# Patient Record
Sex: Female | Born: 2000 | Hispanic: No | Marital: Single | Smoking: Never smoker
Health system: Southern US, Community
[De-identification: ages and names within clinical notes are randomized; demographics above are authoritative.]

## PROBLEM LIST (undated history)

## (undated) DIAGNOSIS — F419 Anxiety disorder, unspecified: Secondary | ICD-10-CM

## (undated) DIAGNOSIS — L309 Dermatitis, unspecified: Secondary | ICD-10-CM

## (undated) DIAGNOSIS — K222 Esophageal obstruction: Secondary | ICD-10-CM

## (undated) HISTORY — DX: Esophageal obstruction: K22.2

---
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
## 2020-01-15 ENCOUNTER — Other Ambulatory Visit: Payer: Self-pay

## 2020-01-15 ENCOUNTER — Emergency Department (HOSPITAL_COMMUNITY)
Admission: EM | Admit: 2020-01-15 | Discharge: 2020-01-15 | Disposition: A | Discharge status: Home / Self Care | Payer: Medicaid Other | Attending: Emergency Medicine | Admitting: Emergency Medicine

## 2020-01-15 ENCOUNTER — Emergency Department (HOSPITAL_COMMUNITY)
Admission: EM | Admit: 2020-01-15 | Discharge: 2020-01-16 | Disposition: A | Discharge status: Home / Self Care | Payer: Medicaid Other | Source: Home / Self Care | Attending: Emergency Medicine | Admitting: Emergency Medicine

## 2020-01-15 DIAGNOSIS — K0889 Other specified disorders of teeth and supporting structures: Secondary | ICD-10-CM

## 2020-01-15 DIAGNOSIS — J45909 Unspecified asthma, uncomplicated: Secondary | ICD-10-CM | POA: Insufficient documentation

## 2020-01-15 MED ORDER — AMOXICILLIN 400 MG/5ML PO SUSR
800.0000 mg | Freq: Two times a day (BID) | ORAL | 0 refills | Status: AC
Start: 2020-01-15 — End: 2020-01-22

## 2020-01-15 NOTE — ED Triage Notes (Signed)
Pt said she has been having right upper tooth pain x 4 days. Swelling in the gum area and hard to chew on that side.

## 2020-01-15 NOTE — ED Provider Notes (Signed)
[facility] EMERGENCY DEPARTMENT Provider Note   CSN: [id number] Arrival date & time: 01/15/20  3976     History Chief Complaint  Patient presents with  . Dental Pain    Autumn Aguilar is a 19 y.o. female.  HPI 19 year old female history of asthma, dental problems, presents today with dental pain in her right upper second and third molar.  Present for 7 days.  Is worse with pressure.  She feels like she may have had some swelling.  She is using ibuprofen at home every 6 hours which relieves the pain for about 40 minutes.  She denies any fever, chills, difficulty speaking, swallowing, or breathing.  She has not had any fever.    Past Medical History:  Diagnosis Date  . Anxiety   . Asthma   . Eczema   . Esophageal stricture     Patient Active Problem List   Diagnosis Date Noted  . Mid back pain 11/11/2016  . Esophageal stricture     No past surgical history on file.   OB History   No obstetric history on file.     No family history on file.  Social History   Tobacco Use  . Smoking status: Never Smoker  . Smokeless tobacco: Never Used  Vaping Use  . Vaping Use: Never used  Substance Use Topics  . Alcohol use: No  . Drug use: No    Home Medications Prior to Admission medications   Medication Sig Start Date End Date Taking? Authorizing Provider  albuterol (PROVENTIL) (2.5 MG/3ML) 0.083% nebulizer solution Take 3 mLs (2.5 mg total) by nebulization every 4 (four) hours as needed for wheezing or shortness of breath. 02/13/19   Maxwell Caul, PA-C  albuterol (VENTOLIN HFA) 108 (90 Base) MCG/ACT inhaler Inhale 2 puffs into the lungs every 4 (four) hours as needed for shortness of breath. 02/13/19   Maxwell Caul, PA-C  amoxicillin (AMOXIL) 400 MG/5ML suspension Take 10 mLs (800 mg total) by mouth in the morning and at bedtime for 7 days. 01/15/20 01/22/20  Margarita Grizzle, MD  B Complex Vitamins (VITAMIN B COMPLEX PO) Take 1 tablet by mouth  daily.    [provider]  benzonatate (TESSALON) 100 MG capsule Take 1-2 capsules (100-200 mg total) by mouth 3 (three) times daily as needed for cough. 11/23/19   Wallis Bamberg, PA-C  cetirizine (ZYRTEC ALLERGY) 10 MG tablet Take 1 tablet (10 mg total) by mouth daily. 11/23/19   Wallis Bamberg, PA-C  fluticasone (FLONASE) 50 MCG/ACT nasal spray Place 1 spray into both nostrils daily. 02/13/19   Graciella Freer A, PA-C  ipratropium (ATROVENT) 0.06 % nasal spray Place 1 spray into both nostrils every 6 (six) hours as needed for rhinitis.  12/22/18   [provider]  promethazine-dextromethorphan (PROMETHAZINE-DM) 6.25-15 MG/5ML syrup Take 5 mLs by mouth at bedtime as needed for cough. 11/23/19   Wallis Bamberg, PA-C  pseudoephedrine (SUDAFED) 30 MG tablet Take 1 tablet (30 mg total) by mouth every 8 (eight) hours as needed for congestion. 11/23/19   Wallis Bamberg, PA-C  rizatriptan (MAXALT) 10 MG tablet Take 1 tablet earliest onset of migraine.  May repeat in 2 hours if needed.  Maximum 2 tablets in 24 hours. 02/09/19   Drema Dallas, DO  nortriptyline (PAMELOR) 10 MG capsule Take 1 capsule at bedtime for a week, then increase to 2 capsules at bedtime. 02/09/19 11/23/19  Drema Dallas, DO    Allergies  Bee venom, Kiwi extract, and Pineapple  Review of Systems   Review of Systems  All other systems reviewed and are negative.   Physical Exam Updated Vital Signs BP 127/72 (BP Location: Left Arm)   Pulse (!) 107   Temp 98.3 F (36.8 C) (Oral)   Resp 20   SpO2 99%   Physical Exam Vitals and nursing note reviewed.  HENT:     Head: Normocephalic.     Right Ear: External ear normal.     Left Ear: External ear normal.     Nose: Nose normal.     Mouth/Throat:     Mouth: Mucous membranes are moist.     Pharynx: Oropharynx is clear.     Comments: Filling in place in right upper second molar some tenderness palpation around first and second molar. Eyes:     Pupils: Pupils are equal,  round, and reactive to light.  Musculoskeletal:        General: Normal range of motion.     Cervical back: Normal range of motion.  Skin:    General: Skin is warm.     Capillary Refill: Capillary refill takes less than 2 seconds.  Neurological:     General: No focal deficit present.     Mental Status: She is alert.     Gait: Gait normal.  Psychiatric:        Mood and Affect: Mood normal.        Behavior: Behavior normal.     ED Results / Procedures / Treatments   Labs (all labs ordered are listed, but only abnormal results are displayed) Labs Reviewed - No data to display  EKG None  Radiology No results found.  Procedures Procedures (including critical care time)  Medications Ordered in ED Medications - No data to display  ED Course  I have reviewed the triage vital signs and the nursing notes.  Pertinent labs & imaging results that were available during my care of the patient were reviewed by me and considered in my medical decision making (see chart for details).    MDM Rules/Calculators/A&P                          Dental pain without any obvious abscess.  Amoxicillin prescribed as patient has difficulty swallowing pills.  Instructed regarding return precautions and need for follow-up and voices understanding. Final Clinical Impression(s) / ED Diagnoses Final diagnoses:  Pain, dental    Rx / DC Orders ED Discharge Orders         Ordered    amoxicillin (AMOXIL) 400 MG/5ML suspension  2 times daily        01/15/20 0730           Margarita Grizzle, MD 01/15/20 [phone]

## 2020-01-16 ENCOUNTER — Other Ambulatory Visit: Payer: Self-pay

## 2020-01-16 ENCOUNTER — Encounter (HOSPITAL_COMMUNITY): Payer: Self-pay | Admitting: *Deleted

## 2020-01-16 MED ORDER — HYDROCODONE-ACETAMINOPHEN 5-325 MG PO TABS
1.0000 | ORAL_TABLET | Freq: Once | ORAL | Status: AC
  Administered 2020-01-16: 1 via ORAL
  Filled 2020-01-16: qty 1

## 2020-01-16 NOTE — ED Triage Notes (Signed)
The pt has a toothache for 4-5 days she was seen here earlier today wand was given antiobotics she thinks the swelling is getting worse  So she came back   lmp now

## 2020-01-16 NOTE — ED Provider Notes (Signed)
[facility] EMERGENCY DEPARTMENT Provider Note   CSN: [id number] Arrival date & time: 01/15/20  2355     History Chief Complaint  Patient presents with   Dental Pain    Autumn Aguilar is a 19 y.o. female.  The history is provided by the patient.  Dental Pain Location:  Upper Quality:  Aching Severity:  Moderate Onset quality:  Gradual Timing:  Constant Progression:  Worsening Chronicity:  New Relieved by:  Nothing Worsened by:  Nothing Associated symptoms: fever    Patient was just seen in the ER for dental pain, but now is worried due to facial swelling.  No visual changes.  No fevers.    Past Medical History:  Diagnosis Date   Anxiety    Asthma    Eczema    Esophageal stricture     Patient Active Problem List   Diagnosis Date Noted   Mid back pain 11/11/2016   Esophageal stricture     History reviewed. No pertinent surgical history.   OB History   No obstetric history on file.     No family history on file.  Social History   Tobacco Use   Smoking status: Never Smoker   Smokeless tobacco: Never Used  Vaping Use   Vaping Use: Never used  Substance Use Topics   Alcohol use: No   Drug use: No    Home Medications Prior to Admission medications   Medication Sig Start Date End Date Taking? Authorizing Provider  albuterol (PROVENTIL) (2.5 MG/3ML) 0.083% nebulizer solution Take 3 mLs (2.5 mg total) by nebulization every 4 (four) hours as needed for wheezing or shortness of breath. 02/13/19   Maxwell Caul, PA-C  albuterol (VENTOLIN HFA) 108 (90 Base) MCG/ACT inhaler Inhale 2 puffs into the lungs every 4 (four) hours as needed for shortness of breath. 02/13/19   Maxwell Caul, PA-C  amoxicillin (AMOXIL) 400 MG/5ML suspension Take 10 mLs (800 mg total) by mouth in the morning and at bedtime for 7 days. 01/15/20 01/22/20  Margarita Grizzle, MD  B Complex Vitamins (VITAMIN B COMPLEX PO) Take 1 tablet by mouth daily.     [provider]  benzonatate (TESSALON) 100 MG capsule Take 1-2 capsules (100-200 mg total) by mouth 3 (three) times daily as needed for cough. 11/23/19   Wallis Bamberg, PA-C  cetirizine (ZYRTEC ALLERGY) 10 MG tablet Take 1 tablet (10 mg total) by mouth daily. 11/23/19   Wallis Bamberg, PA-C  fluticasone (FLONASE) 50 MCG/ACT nasal spray Place 1 spray into both nostrils daily. 02/13/19   Graciella Freer A, PA-C  ipratropium (ATROVENT) 0.06 % nasal spray Place 1 spray into both nostrils every 6 (six) hours as needed for rhinitis.  12/22/18   [provider]  promethazine-dextromethorphan (PROMETHAZINE-DM) 6.25-15 MG/5ML syrup Take 5 mLs by mouth at bedtime as needed for cough. 11/23/19   Wallis Bamberg, PA-C  pseudoephedrine (SUDAFED) 30 MG tablet Take 1 tablet (30 mg total) by mouth every 8 (eight) hours as needed for congestion. 11/23/19   Wallis Bamberg, PA-C  rizatriptan (MAXALT) 10 MG tablet Take 1 tablet earliest onset of migraine.  May repeat in 2 hours if needed.  Maximum 2 tablets in 24 hours. 02/09/19   Drema Dallas, DO  nortriptyline (PAMELOR) 10 MG capsule Take 1 capsule at bedtime for a week, then increase to 2 capsules at bedtime. 02/09/19 11/23/19  Drema Dallas, DO    Allergies    Bee venom, Kiwi extract, and Pineapple  Review of Systems   Review of Systems  Constitutional: Positive for fever.  HENT: Positive for dental problem.     Physical Exam Updated Vital Signs BP 125/85    Pulse 100    Temp 98.4 F (36.9 C)    Resp 17    Ht 1.778 m (5\' 10" )    Wt 56.7 kg    LMP 01/16/2020    SpO2 100%    BMI 17.94 kg/m   Physical Exam CONSTITUTIONAL: Well developed/well nourished HEAD: Normocephalic/atraumatic EYES: EOMI/PERRL ENMT: Mucous membranes moist, no significant facial swelling.  Tenderness noted to right upper molars.  No trismus, no drooling NECK: supple no meningeal signs LUNGS:  no apparent distress ABDOMEN: soft NEURO: Pt is awake/alert/appropriate, moves all  extremitiesx4.  No facial droop.   EXTREMITIES:  full ROM SKIN: warm, color normal PSYCH: no abnormalities of mood noted, alert and oriented to situation  ED Results / Procedures / Treatments   Labs (all labs ordered are listed, but only abnormal results are displayed) Labs Reviewed - No data to display  EKG None  Radiology No results found.  Procedures Procedures (including critical care time)  Medications Ordered in ED Medications  HYDROcodone-acetaminophen (NORCO/VICODIN) 5-325 MG per tablet 1 tablet (has no administration in time range)    ED Course  I have reviewed the triage vital signs and the nursing notes.     MDM Rules/Calculators/A&P                          Continue antibiotics.  No indication for imaging.  Follow-up with dentistry next week Final Clinical Impression(s) / ED Diagnoses Final diagnoses:  Tooth pain    Rx / DC Orders ED Discharge Orders    None       01/18/2020, MD 01/16/20 [phone]

## 2020-02-05 ENCOUNTER — Other Ambulatory Visit: Payer: Self-pay

## 2020-02-05 ENCOUNTER — Encounter (HOSPITAL_BASED_OUTPATIENT_CLINIC_OR_DEPARTMENT_OTHER): Payer: Self-pay | Admitting: *Deleted

## 2020-02-05 ENCOUNTER — Emergency Department (HOSPITAL_BASED_OUTPATIENT_CLINIC_OR_DEPARTMENT_OTHER)
Admission: EM | Admit: 2020-02-05 | Discharge: 2020-02-05 | Disposition: A | Discharge status: Home / Self Care | Payer: Worker's Compensation | Attending: Emergency Medicine | Admitting: Emergency Medicine

## 2020-02-05 ENCOUNTER — Emergency Department (HOSPITAL_BASED_OUTPATIENT_CLINIC_OR_DEPARTMENT_OTHER): Payer: Worker's Compensation

## 2020-02-05 DIAGNOSIS — M542 Cervicalgia: Secondary | ICD-10-CM | POA: Insufficient documentation

## 2020-02-05 DIAGNOSIS — S161XXA Strain of muscle, fascia and tendon at neck level, initial encounter: Secondary | ICD-10-CM | POA: Insufficient documentation

## 2020-02-05 DIAGNOSIS — J45909 Unspecified asthma, uncomplicated: Secondary | ICD-10-CM | POA: Diagnosis not present

## 2020-02-05 DIAGNOSIS — M549 Dorsalgia, unspecified: Secondary | ICD-10-CM | POA: Insufficient documentation

## 2020-02-05 DIAGNOSIS — S0990XA Unspecified injury of head, initial encounter: Secondary | ICD-10-CM | POA: Diagnosis present

## 2020-02-05 DIAGNOSIS — W208XXA Other cause of strike by thrown, projected or falling object, initial encounter: Secondary | ICD-10-CM | POA: Diagnosis not present

## 2020-02-05 DIAGNOSIS — Y990 Civilian activity done for income or pay: Secondary | ICD-10-CM | POA: Diagnosis not present

## 2020-02-05 MED ORDER — IBUPROFEN 100 MG/5ML PO SUSP
400.0000 mg | Freq: Once | ORAL | Status: AC
  Administered 2020-02-05: 400 mg via ORAL
  Filled 2020-02-05: qty 20

## 2020-02-05 NOTE — ED Provider Notes (Signed)
[facility] EMERGENCY DEPARTMENT Provider Note   CSN: [id number] Arrival date & time: 02/05/20  1257     History Chief Complaint  Patient presents with  . Head Injury    Autumn Aguilar is a 19 y.o. female.  Patient is a 19 year old female presents after a head injury.  At around 10am this morning, she was at work and had 3 large bottle of coffee creamer fall on her.  On bottle fell on the top of her head, and two bottles rolled down her neck.  There was no LOC.  No n/v.  She had some dizziness earlier today for about the first hour after the incident, but is better now.  No vision changes currently.  No difficulty with ambulation.  She does have some pain to her neck and upper back.  No numbness or weakness to her extremities.  No other injuries.  She is not on anticoagulants.        Past Medical History:  Diagnosis Date  . Anxiety   . Asthma   . Eczema   . Esophageal stricture     Patient Active Problem List   Diagnosis Date Noted  . Mid back pain 11/11/2016  . Esophageal stricture     History reviewed. No pertinent surgical history.   OB History   No obstetric history on file.     History reviewed. No pertinent family history.  Social History   Tobacco Use  . Smoking status: Never Smoker  . Smokeless tobacco: Never Used  Vaping Use  . Vaping Use: Never used  Substance Use Topics  . Alcohol use: No  . Drug use: No    Home Medications Prior to Admission medications   Medication Sig Start Date End Date Taking? Authorizing Provider  albuterol (PROVENTIL) (2.5 MG/3ML) 0.083% nebulizer solution Take 3 mLs (2.5 mg total) by nebulization every 4 (four) hours as needed for wheezing or shortness of breath. 02/13/19   Maxwell Caul, PA-C  albuterol (VENTOLIN HFA) 108 (90 Base) MCG/ACT inhaler Inhale 2 puffs into the lungs every 4 (four) hours as needed for shortness of breath. 02/13/19   Graciella Freer A, PA-C  B Complex Vitamins (VITAMIN B  COMPLEX PO) Take 1 tablet by mouth daily.    [provider]  benzonatate (TESSALON) 100 MG capsule Take 1-2 capsules (100-200 mg total) by mouth 3 (three) times daily as needed for cough. 11/23/19   Wallis Bamberg, PA-C  cetirizine (ZYRTEC ALLERGY) 10 MG tablet Take 1 tablet (10 mg total) by mouth daily. 11/23/19   Wallis Bamberg, PA-C  fluticasone (FLONASE) 50 MCG/ACT nasal spray Place 1 spray into both nostrils daily. 02/13/19   Graciella Freer A, PA-C  ipratropium (ATROVENT) 0.06 % nasal spray Place 1 spray into both nostrils every 6 (six) hours as needed for rhinitis.  12/22/18   [provider]  promethazine-dextromethorphan (PROMETHAZINE-DM) 6.25-15 MG/5ML syrup Take 5 mLs by mouth at bedtime as needed for cough. 11/23/19   Wallis Bamberg, PA-C  pseudoephedrine (SUDAFED) 30 MG tablet Take 1 tablet (30 mg total) by mouth every 8 (eight) hours as needed for congestion. 11/23/19   Wallis Bamberg, PA-C  rizatriptan (MAXALT) 10 MG tablet Take 1 tablet earliest onset of migraine.  May repeat in 2 hours if needed.  Maximum 2 tablets in 24 hours. 02/09/19   Drema Dallas, DO  nortriptyline (PAMELOR) 10 MG capsule Take 1 capsule at bedtime for a week, then increase to 2 capsules at bedtime. 02/09/19  11/23/19  Drema Dallas, DO    Allergies    Bee venom, Kiwi extract, and Pineapple  Review of Systems   Review of Systems  Constitutional: Negative for chills, diaphoresis, fatigue and fever.  HENT: Negative for congestion, rhinorrhea and sneezing.   Eyes: Negative.   Respiratory: Negative for cough, chest tightness and shortness of breath.   Cardiovascular: Negative for chest pain and leg swelling.  Gastrointestinal: Negative for abdominal pain, blood in stool, diarrhea, nausea and vomiting.  Genitourinary: Negative for difficulty urinating, flank pain, frequency and hematuria.  Musculoskeletal: Positive for neck pain. Negative for arthralgias and back pain.  Skin: Negative for rash.   Neurological: Positive for dizziness and headaches. Negative for speech difficulty, weakness and numbness.    Physical Exam Updated Vital Signs BP 120/72 (BP Location: Right Arm)   Pulse 76   Temp 98.6 F (37 C) (Oral)   Resp 18   Ht 5\' 9"  (1.753 m)   Wt 56.7 kg   LMP 02/05/2020   SpO2 100%   BMI 18.46 kg/m   Physical Exam Constitutional:      Appearance: She is well-developed and well-nourished.  HENT:     Head: Normocephalic and atraumatic.  Eyes:     Pupils: Pupils are equal, round, and reactive to light.  Cardiovascular:     Rate and Rhythm: Normal rate and regular rhythm.     Heart sounds: Normal heart sounds.  Pulmonary:     Effort: Pulmonary effort is normal. No respiratory distress.     Breath sounds: Normal breath sounds. No wheezing or rales.  Chest:     Chest wall: No tenderness.  Abdominal:     General: Bowel sounds are normal.     Palpations: Abdomen is soft.     Tenderness: There is no abdominal tenderness. There is no guarding or rebound.  Musculoskeletal:        General: No edema. Normal range of motion.     Cervical back: Tenderness present.     Comments: Some tenderness to bilateral cervical paraspinal muscles.  Mild midline tenderness.  No step offs or deformities.  No pain to thoracic or LS spine.  Lymphadenopathy:     Cervical: No cervical adenopathy.  Skin:    General: Skin is warm and dry.     Findings: No rash.  Neurological:     Mental Status: She is alert and oriented to person, place, and time.     Comments: Motor 5/5 all extremities Sensation grossly intact to LT all extremities Finger to Nose intact, no pronator drift CN II-XII grossly intact Gait normal   Psychiatric:        Mood and Affect: Mood and affect normal.     ED Results / Procedures / Treatments   Labs (all labs ordered are listed, but only abnormal results are displayed) Labs Reviewed - No data to display  EKG None  Radiology DG Cervical Spine  Complete  Result Date: 02/05/2020 CLINICAL DATA:  Neck injury. EXAM: CERVICAL SPINE - COMPLETE 4+ VIEW COMPARISON:  None. FINDINGS: There is straightening of the normal cervical lordotic curvature. There is no prevertebral soft tissue swelling. There is no definite malalignment or acute displaced fracture. IMPRESSION: Negative cervical spine radiographs. Electronically Signed   By: 02/07/2020 M.D.   On: 02/05/2020 16:06    Procedures Procedures (including critical care time)  Medications Ordered in ED Medications - No data to display  ED Course  I have reviewed the triage vital signs  and the nursing notes.  Pertinent labs & imaging results that were available during my care of the patient were reviewed by me and considered in my medical decision making (see chart for details).    MDM Rules/Calculators/A&P                         Patient is a 19 year old female who presents after head injury.  She also presents with neck pain.  She has some symptoms of a mild concussion but she does not have any symptoms that warrant CT imaging.  There there are no clinical signs or symptoms that are concerning for intracranial hemorrhage.  She is neurologically intact.  She does have some neck pain which seems to be mostly musculoskeletal.  I had a low suspicion for bony injury given the mechanism of injury but the patient was requesting imaging.  I did do x-rays of the cervical spine which showed no acute abnormality.  These were also reviewed by me.  She was discharged home in good condition.  Symptomatic care instructions were given.  She was encouraged to follow-up with her doctor this week for recheck.  Return precautions were given. Final Clinical Impression(s) / ED Diagnoses Final diagnoses:  Injury of head, initial encounter  Cervical strain, acute, initial encounter    Rx / DC Orders ED Discharge Orders    None       Rolan Bucco, MD 02/05/20 1735

## 2020-02-05 NOTE — ED Triage Notes (Addendum)
Food Water quality scientist. Pt reports that a box with 4 coffee creamer bottles fell on her head at 10am this morning. Denies LOC. No hematoma noted. Ambulatory. Denies N/V, denies change in vision. Pt was evaluated by EMS after the accident.

## 2020-02-05 NOTE — ED Notes (Signed)
Pt discharged to home. Discharge instructions have been discussed with patient and/or family members. Pt verbally acknowledges understanding d/c instructions, and endorses comprehension to checkout at registration before leaving.  °

## 2020-02-15 ENCOUNTER — Ambulatory Visit (HOSPITAL_COMMUNITY)
Admission: EM | Admit: 2020-02-15 | Discharge: 2020-02-15 | Disposition: A | Discharge status: Home / Self Care | Payer: Medicaid Other | Attending: Family Medicine | Admitting: Family Medicine

## 2020-02-15 ENCOUNTER — Other Ambulatory Visit: Payer: Self-pay

## 2020-02-15 ENCOUNTER — Encounter (HOSPITAL_COMMUNITY): Payer: Self-pay

## 2020-02-15 DIAGNOSIS — Z20822 Contact with and (suspected) exposure to covid-19: Secondary | ICD-10-CM | POA: Diagnosis not present

## 2020-02-15 DIAGNOSIS — S0990XA Unspecified injury of head, initial encounter: Secondary | ICD-10-CM | POA: Insufficient documentation

## 2020-02-15 DIAGNOSIS — R519 Headache, unspecified: Secondary | ICD-10-CM | POA: Insufficient documentation

## 2020-02-15 DIAGNOSIS — X58XXXA Exposure to other specified factors, initial encounter: Secondary | ICD-10-CM | POA: Diagnosis not present

## 2020-02-15 DIAGNOSIS — R059 Cough, unspecified: Secondary | ICD-10-CM | POA: Diagnosis not present

## 2020-02-15 LAB — POCT RAPID STREP A, ED / UC: Streptococcus, Group A Screen (Direct): NEGATIVE

## 2020-02-15 NOTE — Discharge Instructions (Addendum)
You have been tested for COVID-19 today. If your test returns positive, you will receive a phone call from [facility] regarding your results. Negative test results are not called. Both positive and negative results area always visible on MyChart. If you do not have a MyChart account, sign up instructions are provided in your discharge papers. Please do not hesitate to contact us should you have questions or concerns.  You may use over the counter ibuprofen or acetaminophen as needed.  For a sore throat, over the counter products such as [employer] Peroxyl Mouth Sore Rinse or Chloraseptic Sore Throat Spray may provide some temporary relief. Your rapid strep test was negative today. We have sent your throat swab for culture and will let you know of any positive results.  Do your best to ensure adequate rest. If not allergic, take acetaminophen (Tylenol) every 4-6 hours as needed for discomfort. Often individuals will develop a headache associated with mild nausea in the days or hours after a head injury. This is called a concussion and may require further follow up.  Please seek prompt medical care if: You have: A very bad (severe) headache that is not helped by medicine. Trouble walking or weakness in your arms and legs. Clear or bloody fluid coming from your nose or ears. Changes in your seeing (vision). Jerky movements that you cannot control (seizure). You throw up (vomit). Your symptoms get worse. You lose balance. Your speech is slurred. You pass out. You are sleepier and have trouble staying awake. The black centers of your eyes (pupils) change in size.  These symptoms may be an emergency. Do not wait to see if the symptoms will go away. Get medical help right away. Call your local emergency services. Do not drive yourself to the hospital.

## 2020-02-15 NOTE — ED Triage Notes (Signed)
Pt c/o headaches x 5 days. Pt states she developed a fever and cough yesterday. She states her left tonsil hurts and she states she feels like she is swallowing nails. Pt states she has not eben able to drink or eat. Pt states she also feels dizzy at time and states she recently had a concussion. Pt states last Thursday she was at work when a box of creamers fell on her head and neck from 61ft high.

## 2020-02-15 NOTE — ED Provider Notes (Signed)
[facility] CARE CENTER   [id number] 02/15/20 Arrival Time: 1418  ASSESSMENT & PLAN:  1. Cough   2. Nonintractable headache, unspecified chronicity pattern, unspecified headache type   3. Minor head injury without loss of consciousness, initial encounter      COVID-19 testing sent. See letter/work note on file for self-isolation guidelines. OTC symptom care as needed. Rapid strep negative. Culture sent.    Discharge Instructions     You have been tested for COVID-19 today. If your test returns positive, you will receive a phone call from [facility] regarding your results. Negative test results are not called. Both positive and negative results area always visible on MyChart. If you do not have a MyChart account, sign up instructions are provided in your discharge papers. Please do not hesitate to contact us should you have questions or concerns.   You may use over the counter ibuprofen or acetaminophen as needed.   For a sore throat, over the counter products such as [employer]gate Peroxyl Mouth Sore Rinse or Chloraseptic Sore Throat Spray may provide some temporary relief.  Your rapid strep test was negative today. We have sent your throat swab for culture and will let you know of any positive results.  Do your best to ensure adequate rest. If not allergic, take acetaminophen (Tylenol) every 4-6 hours as needed for discomfort. Often individuals will develop a headache associated with mild nausea in the days or hours after a head injury. This is called a concussion and may require further follow up.  Please seek prompt medical care if:  You have: ? A very bad (severe) headache that is not helped by medicine. ? Trouble walking or weakness in your arms and legs. ? Clear or bloody fluid coming from your nose or ears. ? Changes in your seeing (vision). ? Jerky movements that you cannot control (seizure).  You throw up (vomit).  Your symptoms get worse.  You lose  balance.  Your speech is slurred.  You pass out.  You are sleepier and have trouble staying awake.  The black centers of your eyes (pupils) change in size.  These symptoms may be an emergency. Do not wait to see if the symptoms will go away. Get medical help right away. Call your local emergency services. Do not drive yourself to the hospital.        Follow-up Information    [facility].   Specialty: Pediatrics Why: As needed. Contact information: [address] [place] [address] [phone]        Schedule an appointment as soon as possible for a visit  with [facility].   Why: To be evaluated after your head injury. Contact information: [address] [place] [address] [phone]              Reviewed expectations re: course of current medical issues. Questions answered. Outlined signs and symptoms indicating need for more acute intervention. Understanding verbalized. After Visit Summary given.   SUBJECTIVE: History from: patient. Autumn Aguilar is a 19 y.o. female who presents with worries regarding COVID-19. Known COVID-19 contact: none. Recent travel: none. Reports: subj fever and cough and ST since yesterday; sibling also not feeling well. Denies: difficulty breathing. Normal PO intake without n/v/d. Also reports minor head injury six days ago; details in triage note. Reports was seen in ED. No new symptoms.   OBJECTIVE:  Vitals:   02/15/20 1730  BP: (!) 102/52  Pulse: 89  Resp: 16  Temp: 97.9 F (36.6 C)  TempSrc: Oral  SpO2: 100%    General appearance: alert; no distress Eyes: PERRLA; EOMI; conjunctiva normal HENT: [place]; AT; with mild nasal congestion; throat with mild erythema and slight tonsil enlargement Neck: supple  Lungs: speaks full sentences without difficulty; unlabored Extremities: no edema Skin: warm and dry Neurologic: normal  gait Psychological: alert and cooperative; normal mood and affect  Labs: Results for orders placed or performed during the hospital encounter of 02/15/20  POCT Rapid Strep A  Result Value Ref Range   Streptococcus, Group A Screen (Direct) NEGATIVE NEGATIVE   Labs Reviewed  CULTURE, GROUP A STREP ([facility])  SARS CORONAVIRUS 2 (TAT 6-24 HRS)  POCT RAPID STREP A, ED / UC     Allergies  Allergen Reactions  . Bee Venom Anaphylaxis  . Kiwi Extract   . Pineapple Swelling    Swelling of lips and throat    Past Medical History:  Diagnosis Date  . Anxiety   . Asthma   . Eczema   . Esophageal stricture    Social History   Socioeconomic History  . Marital status: Single    Spouse name: Not on file  . Number of children: Not on file  . Years of education: college freshman  . Highest education level: Some college, no degree  Occupational History  . Occupation: Consulting civil engineer  Tobacco Use  . Smoking status: Never Smoker  . Smokeless tobacco: Never Used  Vaping Use  . Vaping Use: Never used  Substance and Sexual Activity  . Alcohol use: No  . Drug use: No  . Sexual activity: Yes    Birth control/protection: Implant  Other Topics Concern  . Not on file  Social History Narrative   Right handed   One story home   Occasionally caffeine   Social Determinants of Health   Financial Resource Strain: Not on file  Food Insecurity: Not on file  Transportation Needs: Not on file  Physical Activity: Not on file  Stress: Not on file  Social Connections: Not on file  Intimate Partner Violence: Not on file   History reviewed. No pertinent family history. History reviewed. No pertinent surgical history.   Mardella Layman, MD 02/15/20 [phone]

## 2020-02-16 LAB — SARS CORONAVIRUS 2 (TAT 6-24 HRS): SARS Coronavirus 2: NEGATIVE

## 2020-02-17 ENCOUNTER — Telehealth (HOSPITAL_COMMUNITY): Payer: Self-pay | Admitting: Emergency Medicine

## 2020-02-17 LAB — CULTURE, GROUP A STREP (THRC)

## 2020-02-17 MED ORDER — AMOXICILLIN 250 MG/5ML PO SUSR
500.0000 mg | Freq: Two times a day (BID) | ORAL | 0 refills | Status: AC
Start: 2020-02-17 — End: 2020-02-27

## 2020-05-16 ENCOUNTER — Other Ambulatory Visit: Payer: Self-pay

## 2020-05-16 ENCOUNTER — Encounter (HOSPITAL_COMMUNITY): Payer: Self-pay

## 2020-05-16 ENCOUNTER — Emergency Department (HOSPITAL_COMMUNITY)
Admission: EM | Admit: 2020-05-16 | Discharge: 2020-05-16 | Disposition: A | Discharge status: Home / Self Care | Payer: Medicaid Other | Attending: Emergency Medicine | Admitting: Emergency Medicine

## 2020-05-16 DIAGNOSIS — Z7952 Long term (current) use of systemic steroids: Secondary | ICD-10-CM | POA: Diagnosis not present

## 2020-05-16 DIAGNOSIS — Z20822 Contact with and (suspected) exposure to covid-19: Secondary | ICD-10-CM | POA: Insufficient documentation

## 2020-05-16 DIAGNOSIS — R42 Dizziness and giddiness: Secondary | ICD-10-CM | POA: Diagnosis present

## 2020-05-16 DIAGNOSIS — R350 Frequency of micturition: Secondary | ICD-10-CM | POA: Insufficient documentation

## 2020-05-16 DIAGNOSIS — R55 Syncope and collapse: Secondary | ICD-10-CM | POA: Insufficient documentation

## 2020-05-16 DIAGNOSIS — J45909 Unspecified asthma, uncomplicated: Secondary | ICD-10-CM | POA: Insufficient documentation

## 2020-05-16 LAB — BASIC METABOLIC PANEL
Anion gap: 6 (ref 5–15)
BUN: 9 mg/dL (ref 6–20)
CO2: 26 mmol/L (ref 22–32)
Calcium: 9.3 mg/dL (ref 8.9–10.3)
Chloride: 107 mmol/L (ref 98–111)
Creatinine, Ser: 0.8 mg/dL (ref 0.44–1.00)
GFR, Estimated: >60 mL/min (ref >60)
Glucose, Bld: 85 mg/dL (ref 70–99)
Potassium: 3.4 mmol/L — ABNORMAL LOW (ref 3.5–5.1)
Sodium: 139 mmol/L (ref 135–145)

## 2020-05-16 LAB — CBC
HCT: 42 % (ref 36.0–46.0)
Hemoglobin: 14.2 g/dL (ref 12.0–15.0)
MCH: 31.2 pg (ref 26.0–34.0)
MCHC: 33.8 g/dL (ref 30.0–36.0)
MCV: 92.3 fL (ref 80.0–100.0)
Platelets: 251 10*3/uL (ref 150–400)
RBC: 4.55 MIL/uL (ref 3.87–5.11)
RDW: 12.3 % (ref 11.5–15.5)
WBC: 6.8 10*3/uL (ref 4.0–10.5)
nRBC: 0 % (ref 0.0–0.2)

## 2020-05-16 LAB — URINALYSIS, ROUTINE W REFLEX MICROSCOPIC
Glucose, UA: NEGATIVE mg/dL
Ketones, ur: 5 mg/dL — AB
Leukocytes,Ua: NEGATIVE
Nitrite: NEGATIVE
Protein, ur: 30 mg/dL — AB
Specific Gravity, Urine: 1.036 — ABNORMAL HIGH (ref 1.005–1.030)
pH: 5 (ref 5.0–8.0)

## 2020-05-16 LAB — I-STAT BETA HCG BLOOD, ED (MC, WL, AP ONLY): I-stat hCG, quantitative: <5 m[IU]/mL (ref <5)

## 2020-05-16 LAB — POC SARS CORONAVIRUS 2 AG -  ED: SARS Coronavirus 2 Ag: NEGATIVE

## 2020-05-16 MED ORDER — CEPHALEXIN 500 MG PO CAPS
500.0000 mg | ORAL_CAPSULE | Freq: Two times a day (BID) | ORAL | 0 refills | Status: AC
Start: 2020-05-16 — End: 2020-05-23

## 2020-05-16 NOTE — ED Triage Notes (Signed)
Patient complains of 4 days of congestion, cough, dizziness. Complains of fatigue and noted she had fever when she awoke this am. Patient alert and oriented.

## 2020-05-16 NOTE — ED Provider Notes (Signed)
[facility] EMERGENCY DEPARTMENT Provider Note   CSN: [id number] Arrival date & time: 05/16/20  1240     History No chief complaint on file.   Autumn Aguilar is a 20 y.o. female.  HPI   The patient is a 20 year old female, she currently takes no specific daily medications.  She had been on a birth control Nexplanon in the past but that it been removed.  She does have a history of anxiety but is not currently medicated.  The patient reports that for the last several months she has had a problem with dizziness which she describes as feeling lightheaded, near syncopal, she starts to see black spots and this often happens when she is standing and walking and rarely happens when she is sitting or laying down.  She denies any changes in vision or speech, she has no numbness or weakness, she has no diarrhea or constipation, she drinks about 4 bottles of water a day totaling less than 80 ounces.  She has no dysuria but has had some frequency, she denies having any hematuria, she is not on her menstrual cycle at this time and denies any abdominal pain chest pain coughing or shortness of breath.  She has trouble sleeping at night and gets only about 3 to 4 hours per night, she underwent a sleep study ordered by her pediatrician several months ago but is never been told that the results were abnormal or normal.  She tries to use melatonin and Benadryl and ultimately is not having any success with that.  She states that she is eating normally, she does report having some possible hypoglycemia in the past but states she has not checked this in a while   Past Medical History:  Diagnosis Date  . Anxiety   . Asthma   . Eczema   . Esophageal stricture     Patient Active Problem List   Diagnosis Date Noted  . Mid back pain 11/11/2016  . Esophageal stricture     History reviewed. No pertinent surgical history.   OB History   No obstetric history on file.     No family  history on file.  Social History   Tobacco Use  . Smoking status: Never Smoker  . Smokeless tobacco: Never Used  Vaping Use  . Vaping Use: Never used  Substance Use Topics  . Alcohol use: No  . Drug use: No    Home Medications Prior to Admission medications   Medication Sig Start Date End Date Taking? Authorizing Provider  cephALEXin (KEFLEX) 500 MG capsule Take 1 capsule (500 mg total) by mouth 2 (two) times daily for 7 days. 05/16/20 05/23/20 Yes Eber Hong, MD  albuterol (PROVENTIL) (2.5 MG/3ML) 0.083% nebulizer solution Take 3 mLs (2.5 mg total) by nebulization every 4 (four) hours as needed for wheezing or shortness of breath. 02/13/19   Maxwell Caul, PA-C  albuterol (VENTOLIN HFA) 108 (90 Base) MCG/ACT inhaler Inhale 2 puffs into the lungs every 4 (four) hours as needed for shortness of breath. 02/13/19   Graciella Freer A, PA-C  B Complex Vitamins (VITAMIN B COMPLEX PO) Take 1 tablet by mouth daily.    [provider]  benzonatate (TESSALON) 100 MG capsule Take 1-2 capsules (100-200 mg total) by mouth 3 (three) times daily as needed for cough. 11/23/19   Wallis Bamberg, PA-C  cetirizine (ZYRTEC ALLERGY) 10 MG tablet Take 1 tablet (10 mg total) by mouth daily. 11/23/19   Wallis Bamberg, PA-C  fluticasone (FLONASE) 50 MCG/ACT nasal spray Place 1 spray into both nostrils daily. 02/13/19   Graciella Freer A, PA-C  ipratropium (ATROVENT) 0.06 % nasal spray Place 1 spray into both nostrils every 6 (six) hours as needed for rhinitis.  12/22/18   [provider]  promethazine-dextromethorphan (PROMETHAZINE-DM) 6.25-15 MG/5ML syrup Take 5 mLs by mouth at bedtime as needed for cough. 11/23/19   Wallis Bamberg, PA-C  pseudoephedrine (SUDAFED) 30 MG tablet Take 1 tablet (30 mg total) by mouth every 8 (eight) hours as needed for congestion. 11/23/19   Wallis Bamberg, PA-C  rizatriptan (MAXALT) 10 MG tablet Take 1 tablet earliest onset of migraine.  May repeat in 2 hours if needed.  Maximum  2 tablets in 24 hours. 02/09/19   Drema Dallas, DO  nortriptyline (PAMELOR) 10 MG capsule Take 1 capsule at bedtime for a week, then increase to 2 capsules at bedtime. 02/09/19 11/23/19  Drema Dallas, DO    Allergies    Bee venom, Kiwi extract, and Pineapple  Review of Systems   Review of Systems  All other systems reviewed and are negative.   Physical Exam Updated Vital Signs BP (!) 109/44 (BP Location: Right Arm)   Pulse 86   Temp 97.8 F (36.6 C) (Oral)   Resp 19   SpO2 100%   Physical Exam Vitals and nursing note reviewed.  Constitutional:      General: She is not in acute distress.    Appearance: She is well-developed.  HENT:     Head: Normocephalic and atraumatic.     Nose: No rhinorrhea.     Mouth/Throat:     Mouth: Mucous membranes are moist.     Pharynx: No oropharyngeal exudate.  Eyes:     General: No scleral icterus.       Right eye: No discharge.        Left eye: No discharge.     Conjunctiva/sclera: Conjunctivae normal.     Pupils: Pupils are equal, round, and reactive to light.  Neck:     Thyroid: No thyromegaly.     Vascular: No JVD.  Cardiovascular:     Rate and Rhythm: Normal rate and regular rhythm.     Heart sounds: Normal heart sounds. No murmur heard. No friction rub. No gallop.   Pulmonary:     Effort: Pulmonary effort is normal. No respiratory distress.     Breath sounds: Normal breath sounds. No wheezing or rales.  Abdominal:     General: Bowel sounds are normal. There is no distension.     Palpations: Abdomen is soft. There is no mass.     Tenderness: There is no abdominal tenderness.  Musculoskeletal:        General: No tenderness or deformity. Normal range of motion.     Cervical back: Normal range of motion and neck supple.     Right lower leg: No edema.     Left lower leg: No edema.  Lymphadenopathy:     Cervical: No cervical adenopathy.  Skin:    General: Skin is warm and dry.     Findings: No erythema or rash.   Neurological:     Mental Status: She is alert.     Coordination: Coordination normal.     Comments: Normal speech and coordination, normal cranial nerves III through XII, normal strength in all 4 extremities, normal gait, normal memory  Psychiatric:        Behavior: Behavior normal.     ED Results /  Procedures / Treatments   Labs (all labs ordered are listed, but only abnormal results are displayed) Labs Reviewed  BASIC METABOLIC PANEL - Abnormal; Notable for the following components:      Result Value   Potassium 3.4 (*)    All other components within normal limits  URINALYSIS, ROUTINE W REFLEX MICROSCOPIC - Abnormal; Notable for the following components:   Color, Urine AMBER (*)    APPearance HAZY (*)    Specific Gravity, Urine 1.036 (*)    Hgb urine dipstick SMALL (*)    Bilirubin Urine MODERATE (*)    Ketones, ur 5 (*)    Protein, ur 30 (*)    Bacteria, UA MANY (*)    All other components within normal limits  URINE CULTURE  CBC  TSH  POC SARS CORONAVIRUS 2 AG -  ED  I-STAT BETA HCG BLOOD, ED ([facility], [facility], [facility] ONLY)    EKG EKG Interpretation  Date/Time:  Tuesday May 16 2020 19:02:19 EDT Ventricular Rate:  75 PR Interval:  143 QRS Duration: 92 QT Interval:  382 QTC Calculation: 427 R Axis:   95 Text Interpretation: Sinus rhythm LAE, consider biatrial enlargement Borderline right axis deviation RSR' in V1 or V2, probably normal variant Confirmed by Eber Hong ([id number]) on 05/16/2020 7:05:30 PM   Radiology No results found.  Procedures Procedures   Medications Ordered in ED Medications - No data to display  ED Course  I have reviewed the triage vital signs and the nursing notes.  Pertinent labs & imaging results that were available during my care of the patient were reviewed by me and considered in my medical decision making (see chart for details).    MDM Rules/Calculators/A&P                          There is many squamous cells, there is bacteria  present in the urine, she does have some mild urinary frequency but her dizziness has been going on for months, she has a nonfocal exam and this seems to be more orthostatic or postural.  I encouraged her to drink more fluids, she is agreeable, her other labs appear unremarkable  Negative for Covid Not pregnant Electrolytes normal Creatinine normal No anemia or leukocytosis  Culture urine, refer back to PCP, encouraged at least 8 bottles of water a day, patient agreeable  We will send off thyroid studies, patient course to follow-up with PCP, local resources given.  Final Clinical Impression(s) / ED Diagnoses Final diagnoses:  Near syncope    Rx / DC Orders ED Discharge Orders         Ordered    cephALEXin (KEFLEX) 500 MG capsule  2 times daily        05/16/20 1901           Eber Hong, MD 05/16/20 1905

## 2020-05-16 NOTE — Discharge Instructions (Addendum)
Please drink at least 8 bottles of water a day  Your thyroid studies should be ready to see within the next couple of days, have your doctor follow-up these results  Return to the emergency department for any severe or worsening symptoms  Make sure that you are eating at least 3 meals a day, keep a snack with you at all times that if you get lightheaded you can have some food or water to drink.  Your testing today did not show any specific abnormalities other than a slight urinary infection  Take cephalexin twice a day for the next 7 days

## 2020-05-18 LAB — URINE CULTURE

## 2020-07-24 ENCOUNTER — Other Ambulatory Visit: Payer: Self-pay

## 2020-07-24 ENCOUNTER — Encounter (HOSPITAL_COMMUNITY): Payer: Self-pay

## 2020-07-24 ENCOUNTER — Ambulatory Visit (HOSPITAL_COMMUNITY)
Admission: EM | Admit: 2020-07-24 | Discharge: 2020-07-24 | Disposition: A | Discharge status: Home / Self Care | Payer: Medicaid Other | Attending: Urgent Care | Admitting: Urgent Care

## 2020-07-24 DIAGNOSIS — B349 Viral infection, unspecified: Secondary | ICD-10-CM | POA: Diagnosis not present

## 2020-07-24 DIAGNOSIS — J111 Influenza due to unidentified influenza virus with other respiratory manifestations: Secondary | ICD-10-CM | POA: Diagnosis present

## 2020-07-24 DIAGNOSIS — U071 COVID-19: Secondary | ICD-10-CM | POA: Insufficient documentation

## 2020-07-24 DIAGNOSIS — J45909 Unspecified asthma, uncomplicated: Secondary | ICD-10-CM | POA: Insufficient documentation

## 2020-07-24 DIAGNOSIS — Z79899 Other long term (current) drug therapy: Secondary | ICD-10-CM | POA: Insufficient documentation

## 2020-07-24 DIAGNOSIS — Z28310 Unvaccinated for covid-19: Secondary | ICD-10-CM | POA: Insufficient documentation

## 2020-07-24 DIAGNOSIS — Z91030 Bee allergy status: Secondary | ICD-10-CM | POA: Diagnosis not present

## 2020-07-24 LAB — POC INFLUENZA A AND B ANTIGEN (URGENT CARE ONLY)
INFLUENZA A ANTIGEN, POC: NEGATIVE
INFLUENZA B ANTIGEN, POC: NEGATIVE

## 2020-07-24 MED ORDER — CETIRIZINE HCL 10 MG PO TABS: 10.0000 mg | ORAL_TABLET | Freq: Every day | ORAL | 0 refills | Status: AC

## 2020-07-24 MED ORDER — PROMETHAZINE-DM 6.25-15 MG/5ML PO SYRP: 5.0000 mL | ORAL_SOLUTION | Freq: Every evening | ORAL | 0 refills | Status: DC | PRN

## 2020-07-24 MED ORDER — PSEUDOEPHEDRINE HCL 30 MG PO TABS: 30.0000 mg | ORAL_TABLET | Freq: Three times a day (TID) | ORAL | 0 refills | Status: DC | PRN

## 2020-07-24 NOTE — ED Provider Notes (Signed)
[name]   MRN: [id number] DOB: Oct 07, 2000  Subjective:   Autumn Aguilar is a 20 y.o. female presenting for 2-day history of acute onset dry cough, body aches, headache, fatigue.  Patient had close exposure to her nephew who tested positive for influenza.  Patient does not have vaccination against COVID or the flu.  Would like to get tested.  She has tried NyQuil with minimal relief.  Denies chest pain, shortness of breath.  No current facility-administered medications for this encounter.  Current Outpatient Medications:  .  albuterol (PROVENTIL) (2.5 MG/3ML) 0.083% nebulizer solution, Take 3 mLs (2.5 mg total) by nebulization every 4 (four) hours as needed for wheezing or shortness of breath., Disp: 75 mL, Rfl: 0 .  albuterol (VENTOLIN HFA) 108 (90 Base) MCG/ACT inhaler, Inhale 2 puffs into the lungs every 4 (four) hours as needed for shortness of breath., Disp: 8 g, Rfl: 1 .  B Complex Vitamins (VITAMIN B COMPLEX PO), Take 1 tablet by mouth daily., Disp: , Rfl:  .  benzonatate (TESSALON) 100 MG capsule, Take 1-2 capsules (100-200 mg total) by mouth 3 (three) times daily as needed for cough., Disp: 60 capsule, Rfl: 0 .  cetirizine (ZYRTEC ALLERGY) 10 MG tablet, Take 1 tablet (10 mg total) by mouth daily., Disp: 30 tablet, Rfl: 0 .  fluticasone (FLONASE) 50 MCG/ACT nasal spray, Place 1 spray into both nostrils daily., Disp: 16 g, Rfl: 2 .  ipratropium (ATROVENT) 0.06 % nasal spray, Place 1 spray into both nostrils every 6 (six) hours as needed for rhinitis. , Disp: , Rfl:  .  promethazine-dextromethorphan (PROMETHAZINE-DM) 6.25-15 MG/5ML syrup, Take 5 mLs by mouth at bedtime as needed for cough., Disp: 100 mL, Rfl: 0 .  pseudoephedrine (SUDAFED) 30 MG tablet, Take 1 tablet (30 mg total) by mouth every 8 (eight) hours as needed for congestion., Disp: 30 tablet, Rfl: 0 .  rizatriptan (MAXALT) 10 MG tablet, Take 1 tablet earliest onset of migraine.  May repeat in 2 hours if  needed.  Maximum 2 tablets in 24 hours., Disp: 10 tablet, Rfl: 3   Allergies  Allergen Reactions  . Bee Venom Anaphylaxis  . Kiwi Extract   . Pineapple Swelling    Swelling of lips and throat    Past Medical History:  Diagnosis Date  . Anxiety   . Asthma   . Eczema   . Esophageal stricture      History reviewed. No pertinent surgical history.  History reviewed. No pertinent family history.  Social History   Tobacco Use  . Smoking status: Never Smoker  . Smokeless tobacco: Never Used  Vaping Use  . Vaping Use: Never used  Substance Use Topics  . Alcohol use: No  . Drug use: No    ROS   Objective:   Vitals: BP (!) 106/58 (BP Location: Right Arm)   Pulse (!) 103   Temp 99.4 F (37.4 C) (Oral)   Resp 17   SpO2 100%   Physical Exam Constitutional:      General: She is not in acute distress.    Appearance: Normal appearance. She is well-developed. She is not ill-appearing, toxic-appearing or diaphoretic.  HENT:     Head: Normocephalic and atraumatic.     Right Ear: External ear normal.     Left Ear: External ear normal.     Nose: Nose normal.     Mouth/Throat:     Mouth: Mucous membranes are moist.     Pharynx:  No oropharyngeal exudate or posterior oropharyngeal erythema.     Comments: Significant postnasal drainage overlying pharynx. Eyes:     General: No scleral icterus.       Right eye: No discharge.        Left eye: No discharge.     Extraocular Movements: Extraocular movements intact.     Conjunctiva/sclera: Conjunctivae normal.     Pupils: Pupils are equal, round, and reactive to light.  Cardiovascular:     Rate and Rhythm: Normal rate and regular rhythm.     Pulses: Normal pulses.     Heart sounds: Normal heart sounds. No murmur heard. No friction rub. No gallop.   Pulmonary:     Effort: Pulmonary effort is normal. No respiratory distress.     Breath sounds: Normal breath sounds. No stridor. No wheezing, rhonchi or rales.  Skin:    General:  Skin is warm and dry.     Findings: No rash.  Neurological:     Mental Status: She is alert and oriented to person, place, and time.     Cranial Nerves: No cranial nerve deficit.     Motor: No weakness.     Coordination: Coordination normal.     Gait: Gait normal.     Deep Tendon Reflexes: Reflexes normal.  Psychiatric:        Mood and Affect: Mood normal.        Behavior: Behavior normal.        Thought Content: Thought content normal.        Judgment: Judgment normal.     Results for orders placed or performed during the hospital encounter of 07/24/20 (from the past 24 hour(s))  POC Influenza A & B Ag (Urgent Care)     Status: None   Collection Time: 07/24/20  7:32 PM  Result Value Ref Range   INFLUENZA A ANTIGEN, POC NEGATIVE NEGATIVE   INFLUENZA B ANTIGEN, POC NEGATIVE NEGATIVE     Assessment and Plan :   PDMP not reviewed this encounter.  1. Influenza-like illness     COVID 19 testing pending, will manage for influenza like viral illness. Recommended supportive care. Counseled patient on potential for adverse effects with medications prescribed/recommended today, ER and return-to-clinic precautions discussed, patient verbalized understanding.    Wallis Bamberg, [place] 07/24/20 1945

## 2020-07-24 NOTE — ED Triage Notes (Signed)
Pt presents with non productive cough, generalized body aches, headache, and fatigue X 2 days.

## 2020-07-24 NOTE — Discharge Instructions (Signed)

## 2020-07-25 LAB — SARS CORONAVIRUS 2 (TAT 6-24 HRS): SARS Coronavirus 2: POSITIVE — AB

## 2020-07-27 ENCOUNTER — Other Ambulatory Visit (HOSPITAL_BASED_OUTPATIENT_CLINIC_OR_DEPARTMENT_OTHER): Payer: Self-pay

## 2020-07-27 DIAGNOSIS — G478 Other sleep disorders: Secondary | ICD-10-CM

## 2020-08-30 ENCOUNTER — Other Ambulatory Visit (HOSPITAL_BASED_OUTPATIENT_CLINIC_OR_DEPARTMENT_OTHER): Payer: Self-pay

## 2020-08-30 DIAGNOSIS — G479 Sleep disorder, unspecified: Secondary | ICD-10-CM

## 2020-09-01 ENCOUNTER — Encounter (HOSPITAL_BASED_OUTPATIENT_CLINIC_OR_DEPARTMENT_OTHER): Payer: Medicaid Other | Admitting: Internal Medicine

## 2020-09-18 ENCOUNTER — Telehealth: Payer: Self-pay

## 2020-09-18 NOTE — Telephone Encounter (Signed)
Called and LVM for patient in regards to upcoming appointment, would like to know if patient has ever had a sleep study and if so where? If not, nothing further is needed.

## 2020-09-18 NOTE — Progress Notes (Signed)
09/19/20 19 yoF never smoker for sleep evaluation courtesy of Dr Holly Bodily with concern of sleep disorder. Medical problem list includes Asthma, Esophageal Stricture, Eczema, Anxiety, Headache, NPSG 08/09/19 Reuel Derby, MD)- AHI 0.6/ hr, desaturation to 92%, body weight 124 lbs, REM 34.6% MSLT 08/10/19 Duwayne Heck Artis)- MSLT-Mean latency 12:45 minutes, slept on 5 naps, no SOREMS, Increased REM pressure and short sleep latency on NPSG   " Idiopathic Hypersomnia" Epwworth score- Body weight today- Covid vax- none She reports daytime tiredness since childhood, getting worse.. Occasional sleep paralysis, no cataplexy. Boyfriend tells her she snores. 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  Often drinks Iced coffee with expresso shots to keep going at work. Bedtme around 3-4AM. Up 10-12noon. Feels "exhausted" in day Melatonin kept her up. Nyquil blamed for sleep paralysis. A prescribed sleep med didn't help. Not pregnant. No ENT surgery. Aware of GERD and perennial rhinitis- flonase helps.  Father has difficulty with delayed sleep onset. 1 sister "can fall asleep standing up", another has insomnia.  Prior to Admission medications   Medication Sig Start Date End Date Taking? Authorizing Provider  albuterol (PROVENTIL) (2.5 MG/3ML) 0.083% nebulizer solution Take 3 mLs (2.5 mg total) by nebulization every 4 (four) hours as needed for wheezing or shortness of breath. 02/13/19  Yes Maxwell Caul, PA-C  albuterol (VENTOLIN HFA) 108 (90 Base) MCG/ACT inhaler Inhale 2 puffs into the lungs every 4 (four) hours as needed for shortness of breath. 02/13/19  Yes Graciella Freer A, PA-C  B Complex Vitamins (VITAMIN B COMPLEX PO) Take 1 tablet by mouth daily.   Yes [provider]  cetirizine (ZYRTEC ALLERGY) 10 MG tablet Take 1 tablet (10 mg total) by mouth daily. 07/24/20  Yes Wallis Bamberg, PA-C  fluticasone (FLONASE) 50 MCG/ACT nasal spray Place 1 spray into both nostrils daily. 02/13/19   Yes Graciella Freer A, PA-C  ipratropium (ATROVENT) 0.06 % nasal spray Place 1 spray into both nostrils every 6 (six) hours as needed for rhinitis.  12/22/18  Yes [provider]  modafinil (PROVIGIL) 100 MG tablet Take 1 tablet (100 mg total) by mouth daily. 09/19/20  Yes Draper Gallon, Joni Fears D, MD  rizatriptan (MAXALT) 10 MG tablet Take 1 tablet earliest onset of migraine.  May repeat in 2 hours if needed.  Maximum 2 tablets in 24 hours. 02/09/19  Yes Drema Dallas, DO  zolpidem (AMBIEN) 5 MG tablet 1 at bedtime for sleep 09/19/20  Yes Lizeth Bencosme, Joni Fears D, MD  benzonatate (TESSALON) 100 MG capsule Take 1-2 capsules (100-200 mg total) by mouth 3 (three) times daily as needed for cough. Patient not taking: Reported on 09/19/2020 11/23/19   Wallis Bamberg, PA-C  promethazine-dextromethorphan (PROMETHAZINE-DM) 6.25-15 MG/5ML syrup Take 5 mLs by mouth at bedtime as needed for cough. Patient not taking: Reported on 09/19/2020 07/24/20   Wallis Bamberg, PA-C  pseudoephedrine (SUDAFED) 30 MG tablet Take 1 tablet (30 mg total) by mouth every 8 (eight) hours as needed for congestion. Patient not taking: Reported on 09/19/2020 07/24/20   Wallis Bamberg, PA-C  nortriptyline (PAMELOR) 10 MG capsule Take 1 capsule at bedtime for a week, then increase to 2 capsules at bedtime. 02/09/19 11/23/19  Drema Dallas, DO   Past Medical History:  Diagnosis Date   Anxiety    Asthma    Eczema    Esophageal stricture    No past surgical history on file. No family history on file. Social History   Socioeconomic History   Marital status: Single  Spouse name: Not on file   Number of children: Not on file   Years of education: college freshman   Highest education level: Some college, no degree  Occupational History   Occupation: Consulting civil engineer  Tobacco Use   Smoking status: Never   Smokeless tobacco: Never  Vaping Use   Vaping Use: Never used  Substance and Sexual Activity   Alcohol use: No   Drug use: No   Sexual activity: Yes     Birth control/protection: Implant  Other Topics Concern   Not on file  Social History Narrative   Right handed   One story home   Occasionally caffeine   Social Determinants of Health   Financial Resource Strain: Not on file  Food Insecurity: Not on file  Transportation Needs: Not on file  Physical Activity: Not on file  Stress: Not on file  Social Connections: Not on file  Intimate Partner Violence: Not on file   ROS-see HPI   + = positive Constitutional:    weight loss, night sweats, fevers, chills, +fatigue, lassitude. HEENT:    +headaches, difficulty swallowing, tooth/dental problems, sore throat,       sneezing, itching, ear ache, +nasal congestion, post nasal drip, snoring CV:    chest pain, orthopnea, PND, swelling in lower extremities, anasarca,                                  dizziness, palpitations Resp:   +shortness of breath with exertion or at rest.                +productive cough,   non-productive cough, coughing up of blood.              change in color of mucus.  wheezing.   Skin:    rash or lesions. GI:  + heartburn, +indigestion, abdominal pain, nausea, vomiting, diarrhea,                 change in bowel habits, loss of appetite GU: dysuria, change in color of urine, no urgency or frequency.   flank pain. MS:   joint pain, stiffness, decreased range of motion, back pain. Neuro-     nothing unusual Psych:  change in mood or affect.  depression or +anxiety.   memory loss.  OBJ- Physical Exam General- Alert, Oriented, Affect-appropriate, Distress- none acute, +slendeer Skin- rash-none, lesions- none, excoriation- none Lymphadenopathy- none Head- atraumatic            Eyes- Gross vision intact, PERRLA, conjunctivae and secretions clear            Ears- Hearing, canals-normal            Nose- Clear, no-Septal dev, mucus, polyps, erosion, perforation             Throat- Mallampati II , mucosa clear , drainage- none, tonsils+moderate, + teeth Neck- flexible ,  trachea midline, no stridor , thyroid nl, carotid no bruit Chest - symmetrical excursion , unlabored           Heart/CV- RRR , no murmur , no gallop  , no rub, nl s1 s2                           - JVD- none , edema- none, stasis changes- none, varices- none           Lung- clear to P&A, wheeze-  none, cough- none , dullness-none, rub- none           Chest wall-  Abd-  Br/ Gen/ Rectal- Not done, not indicated Extrem- cyanosis- none, clubbing, none, atrophy- none, strength- nl Neuro- grossly intact to observation

## 2020-09-19 ENCOUNTER — Other Ambulatory Visit: Payer: Self-pay

## 2020-09-19 ENCOUNTER — Ambulatory Visit (INDEPENDENT_AMBULATORY_CARE_PROVIDER_SITE_OTHER): Payer: Medicaid Other | Admitting: Internal Medicine

## 2020-09-19 ENCOUNTER — Encounter: Payer: Self-pay | Admitting: Internal Medicine

## 2020-09-19 DIAGNOSIS — K219 Gastro-esophageal reflux disease without esophagitis: Secondary | ICD-10-CM

## 2020-09-19 DIAGNOSIS — J4520 Mild intermittent asthma, uncomplicated: Secondary | ICD-10-CM

## 2020-09-19 DIAGNOSIS — J3089 Other allergic rhinitis: Secondary | ICD-10-CM | POA: Diagnosis not present

## 2020-09-19 DIAGNOSIS — R400 Somnolence: Secondary | ICD-10-CM

## 2020-09-19 DIAGNOSIS — J302 Other seasonal allergic rhinitis: Secondary | ICD-10-CM

## 2020-09-19 MED ORDER — ZOLPIDEM TARTRATE 5 MG PO TABS: ORAL_TABLET | ORAL | 1 refills | Status: DC

## 2020-09-19 MED ORDER — MODAFINIL 100 MG PO TABS: 100.0000 mg | ORAL_TABLET | Freq: Every day | ORAL | 1 refills | Status: DC

## 2020-09-19 NOTE — Assessment & Plan Note (Signed)
She describes "bad" heartburn. Emphasize reflux precautions

## 2020-09-19 NOTE — Assessment & Plan Note (Signed)
Inhaler makes her jittery. Discussed

## 2020-09-19 NOTE — Assessment & Plan Note (Signed)
Discussed regular use of flonase. Nonsedating antihistamine.

## 2020-09-19 NOTE — Patient Instructions (Signed)
Aim for a regular bedtime - maybe 1:00 or 2:00 AM. Avoid caffeine and try to wind down starting a few hours before that.  Script sent for ambien 5 mg   1 about 30 minutes before bed  Aim for a regular getting up time so that we can try for 6-8 hours of sleep every day.  Script sent for modafinil 100 mg. Take this every morning at breakfast.  Try for regular meal times- help your body protect a day-night rhythm

## 2020-09-19 NOTE — Assessment & Plan Note (Signed)
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 Also try to stabilize sleep with regular meals, Ambien at bedtime, modafinil at breakfast.  Once we see how this affects her, we will consider updating NPSG/ MSLT.

## 2020-09-20 IMAGING — DX DG CHEST 1V PORT
1 series · 1 of 1 positions shown · non-contrast
Comparison: 01/01/2019

CLINICAL DATA: Neck pain and shortness of breath

EXAM:
PORTABLE CHEST 1 VIEW

[chest ap]
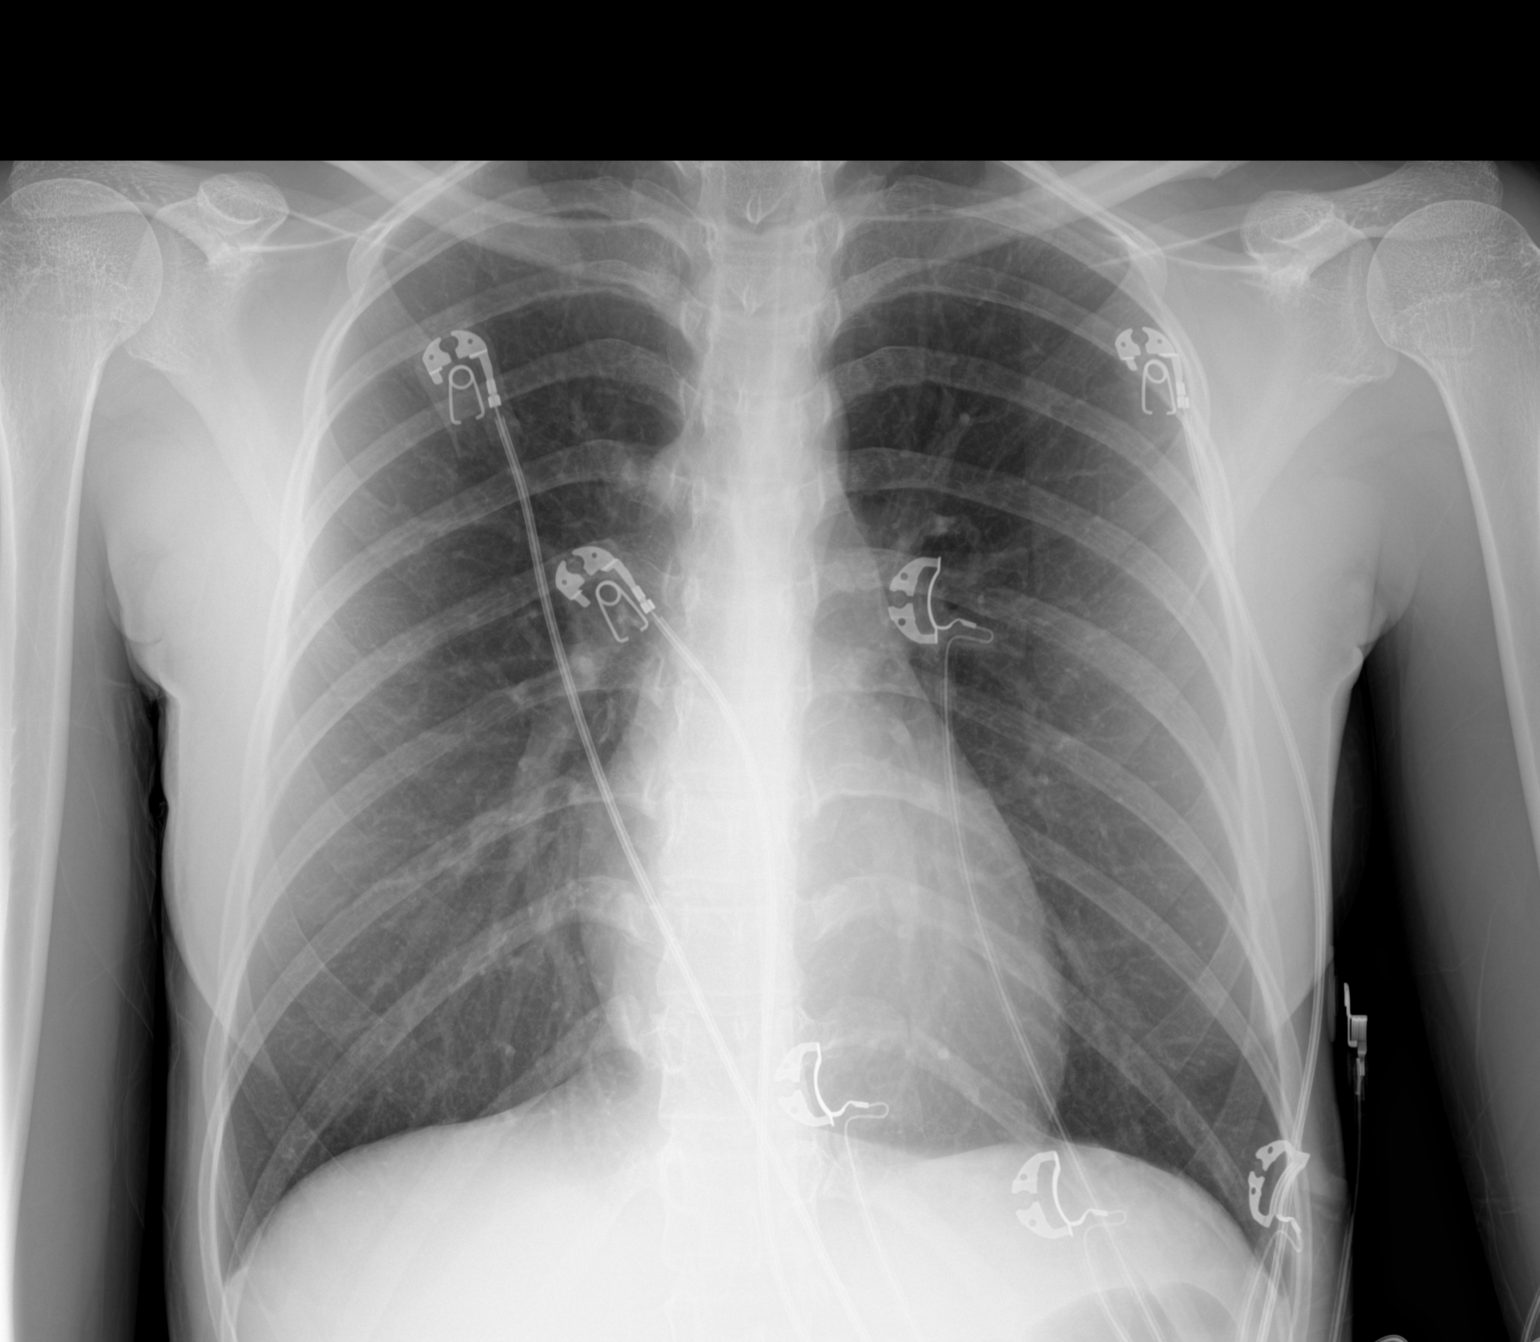

[1 of 1 positions shown; findings below may reference images not displayed]

FINDINGS: The heart size and mediastinal contours are within normal limits.
Both lungs are clear. The visualized skeletal structures are
unremarkable.
IMPRESSION: No active disease.

## 2020-10-04 IMAGING — DX DG CHEST 1V PORT
1 series · 1 of 1 positions shown · non-contrast
Comparison: 01/30/2019

CLINICAL DATA: Shortness of breath.

EXAM:
PORTABLE CHEST 1 VIEW

[chest]
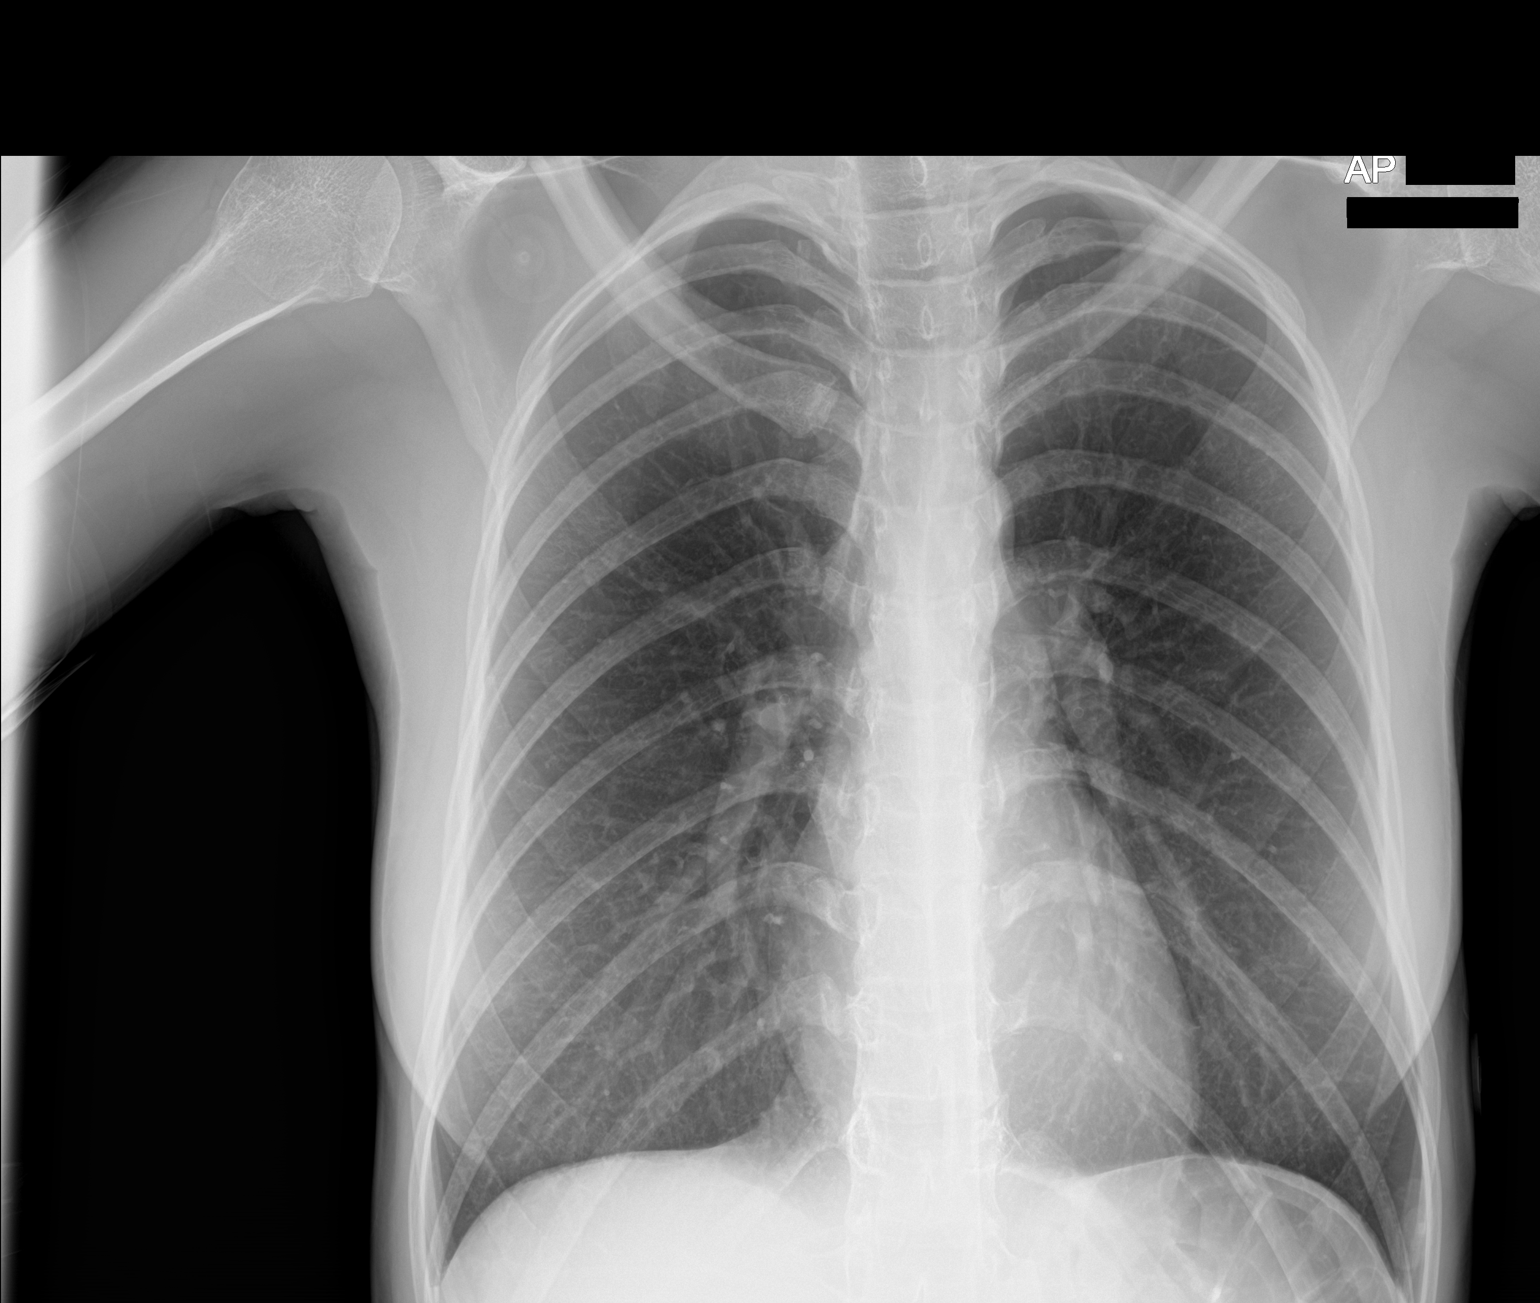

[1 of 1 positions shown; findings below may reference images not displayed]

FINDINGS: The heart size and mediastinal contours are within normal limits.
Both lungs are clear. The visualized skeletal structures are
unremarkable.
IMPRESSION: No active disease.

## 2020-10-09 ENCOUNTER — Encounter (HOSPITAL_BASED_OUTPATIENT_CLINIC_OR_DEPARTMENT_OTHER): Payer: Medicaid Other | Admitting: Internal Medicine

## 2020-10-10 ENCOUNTER — Encounter (HOSPITAL_BASED_OUTPATIENT_CLINIC_OR_DEPARTMENT_OTHER): Payer: Medicaid Other | Admitting: Internal Medicine

## 2020-10-23 NOTE — Progress Notes (Signed)
09/19/20 19 yoF never smoker for sleep evaluation courtesy of Dr Holly Bodily with concern of sleep disorder. Medical problem list includes Asthma, Esophageal Stricture, Eczema, Anxiety, Headache, NPSG 08/09/19 Reuel Derby, MD)- AHI 0.6/ hr, desaturation to 92%, body weight 124 lbs, REM 34.6% MSLT 08/10/19 Duwayne Heck Artis)- MSLT-Mean latency 12:45 minutes, slept on 5 naps, no SOREMS, Increased REM pressure and short sleep latency on NPSG   " Idiopathic Hypersomnia" Epwworth score- Body weight today- Covid vax- none She reports daytime tiredness since childhood, getting worse.. Occasional sleep paralysis, no cataplexy. Boyfriend tells her she snores. 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  Often drinks Iced coffee with expresso shots to keep going at work. Bedtme around 3-4AM. Up 10-12noon. Feels "exhausted" in day Melatonin kept her up. Nyquil blamed for sleep paralysis. A prescribed sleep med didn't help. Not pregnant. No ENT surgery. Aware of GERD and perennial rhinitis- flonase helps.  Father has difficulty with delayed sleep onset. 1 sister "can fall asleep standing up", another has insomnia.  10/24/20- 20 yof never smoker followed for Excessive Daytime Somnolence/ Idiopathic, Delayed Sleep Phase,  complicated by Asthma, GERD/ Esophageal Stricture, Eczema, Anxiety, Headache, -Modafinil 100 mg, ambien 5 mg, --unable to afford We emphasized sleep hygiene, Ambien to anchor bedtime, Modafinil to anchor morning Body weight today- Covid vax-none Medicaid won't cover the meds we discussed. She is job-seeking now.  We emphasized sleep hygiene for regular schedule. Discussed otc use of caffeine tab in the morning, otc antihistamine type sleep aid and melatonin at bedtime for trial.   ROS-see HPI   + = positive Constitutional:    weight loss, night sweats, fevers, chills, +fatigue, lassitude. HEENT:    +headaches, difficulty swallowing, tooth/dental problems, sore throat,       sneezing,  itching, ear ache, +nasal congestion, post nasal drip, snoring CV:    chest pain, orthopnea, PND, swelling in lower extremities, anasarca,                                   dizziness, palpitations Resp:   +shortness of breath with exertion or at rest.                +productive cough,   non-productive cough, coughing up of blood.              change in color of mucus.  wheezing.   Skin:    rash or lesions. GI:  + heartburn, +indigestion, abdominal pain, nausea, vomiting, diarrhea,                 change in bowel habits, loss of appetite GU: dysuria, change in color of urine, no urgency or frequency.   flank pain. MS:   joint pain, stiffness, decreased range of motion, back pain. Neuro-     nothing unusual Psych:  change in mood or affect.  depression or +anxiety.   memory loss.  OBJ- Physical Exam General- Alert, Oriented, Affect-appropriate, Distress- none acute, +slender Skin- rash-none, lesions- none, excoriation- none Lymphadenopathy- none Head- atraumatic            Eyes- Gross vision intact, PERRLA, conjunctivae and secretions clear            Ears- Hearing, canals-normal            Nose- Clear, no-Septal dev, mucus, polyps, erosion, perforation             Throat- Mallampati II ,  mucosa clear , drainage- none, tonsils+moderate, + teeth Neck- flexible , trachea midline, no stridor , thyroid nl, carotid no bruit Chest - symmetrical excursion , unlabored           Heart/CV- RRR , no murmur , no gallop  , no rub, nl s1 s2                           - JVD- none , edema- none, stasis changes- none, varices- none           Lung- clear to P&A, wheeze- none, cough- none , dullness-none, rub- none           Chest wall-  Abd-  Br/ Gen/ Rectal- Not done, not indicated Extrem- cyanosis- none, clubbing, none, atrophy- none, strength- nl Neuro- grossly intact to observation

## 2020-10-24 ENCOUNTER — Encounter: Payer: Self-pay | Admitting: Internal Medicine

## 2020-10-24 ENCOUNTER — Other Ambulatory Visit: Payer: Self-pay

## 2020-10-24 ENCOUNTER — Ambulatory Visit (INDEPENDENT_AMBULATORY_CARE_PROVIDER_SITE_OTHER): Payer: Medicaid Other | Admitting: Internal Medicine

## 2020-10-24 DIAGNOSIS — R400 Somnolence: Secondary | ICD-10-CM | POA: Diagnosis not present

## 2020-10-24 NOTE — Patient Instructions (Signed)
Suggest an otc sleep med- like Zzquil, Unisom, Sominex to help set a bedtime- maybe 11PM  Try otc caffeine tablet in the morning- like NoDoz or Vivarin    Store brand is fine  Ok to take an occasional short nap to clear your head.

## 2020-10-24 NOTE — Assessment & Plan Note (Addendum)
Pending a regular job with insurance, she will work on sleep habits and otc meds as needed. I suspect a regular job may help reduce anxiety, whicch would help sleep as well.  Reassess role of updated NPSG/ MSLT if appropriate at future visit.

## 2021-01-17 ENCOUNTER — Ambulatory Visit: Payer: Medicaid Other | Admitting: Women's Health

## 2021-01-28 ENCOUNTER — Encounter (HOSPITAL_BASED_OUTPATIENT_CLINIC_OR_DEPARTMENT_OTHER): Payer: Self-pay | Admitting: Emergency Medicine

## 2021-01-28 ENCOUNTER — Other Ambulatory Visit: Payer: Self-pay

## 2021-01-28 ENCOUNTER — Emergency Department (HOSPITAL_BASED_OUTPATIENT_CLINIC_OR_DEPARTMENT_OTHER)
Admission: EM | Admit: 2021-01-28 | Discharge: 2021-01-28 | Disposition: A | Discharge status: Home / Self Care | Payer: Medicaid Other | Attending: Emergency Medicine | Admitting: Emergency Medicine

## 2021-01-28 ENCOUNTER — Emergency Department (HOSPITAL_BASED_OUTPATIENT_CLINIC_OR_DEPARTMENT_OTHER): Payer: Medicaid Other | Admitting: Radiology

## 2021-01-28 DIAGNOSIS — R000 Tachycardia, unspecified: Secondary | ICD-10-CM | POA: Diagnosis not present

## 2021-01-28 DIAGNOSIS — J101 Influenza due to other identified influenza virus with other respiratory manifestations: Secondary | ICD-10-CM | POA: Insufficient documentation

## 2021-01-28 DIAGNOSIS — Z20822 Contact with and (suspected) exposure to covid-19: Secondary | ICD-10-CM | POA: Diagnosis not present

## 2021-01-28 DIAGNOSIS — J45909 Unspecified asthma, uncomplicated: Secondary | ICD-10-CM | POA: Diagnosis not present

## 2021-01-28 DIAGNOSIS — R059 Cough, unspecified: Secondary | ICD-10-CM | POA: Diagnosis present

## 2021-01-28 LAB — RESP PANEL BY RT-PCR (FLU A&B, COVID) ARPGX2
Influenza A by PCR: POSITIVE — AB
Influenza B by PCR: NEGATIVE
SARS Coronavirus 2 by RT PCR: NEGATIVE

## 2021-01-28 MED ORDER — ACETAMINOPHEN 160 MG/5ML PO SUSP: 320.0000 mg | Freq: Four times a day (QID) | ORAL | 0 refills | Status: AC | PRN

## 2021-01-28 MED ORDER — ONDANSETRON HCL 4 MG PO TABS: 4.0000 mg | ORAL_TABLET | Freq: Three times a day (TID) | ORAL | 0 refills | Status: AC | PRN

## 2021-01-28 MED ORDER — IBUPROFEN 100 MG/5ML PO SUSP
600.0000 mg | Freq: Once | ORAL | Status: AC
  Administered 2021-01-28: 600 mg via ORAL
  Filled 2021-01-28: qty 30

## 2021-01-28 MED ORDER — IBUPROFEN 100 MG/5ML PO SUSP: 600.0000 mg | ORAL | 0 refills | Status: AC | PRN

## 2021-01-28 MED ORDER — FLUTICASONE PROPIONATE 50 MCG/ACT NA SUSP: 1.0000 | Freq: Every day | NASAL | 0 refills | Status: AC

## 2021-01-28 MED ORDER — SUCRALFATE 1 G PO TABS: 1.0000 g | ORAL_TABLET | Freq: Three times a day (TID) | ORAL | 0 refills | Status: DC

## 2021-01-28 MED ORDER — DEXTROMETHORPHAN POLISTIREX ER 30 MG/5ML PO SUER: 15.0000 mg | ORAL | 0 refills | Status: DC | PRN

## 2021-01-28 NOTE — Discharge Instructions (Addendum)
The pharmacy at [facility] is closed today, please go to the [facility] on [address] for your prescriptions

## 2021-01-28 NOTE — ED Notes (Signed)
Patient tolerating PO fluids without difficulty.

## 2021-01-28 NOTE — ED Provider Notes (Signed)
[facility] EMERGENCY DEPT Provider Note   CSN: [id number] Arrival date & time: 01/28/21  1134     History Chief Complaint  Patient presents with   Cough    Autumn Aguilar is a 20 y.o. female.  This is a 20 y.o. female with significant medical history as below, including asthma, eczema who presents to the ED with complaint of URI s/s. Pt reports 3 days of body aches, fevers, chills, cough, nausea, vomiting.  Recent exposure to family member with similar symptoms.  Patient has not received flu shot this year.  She has not been taking medications to help with her symptoms.  She is tolerant oral intake without difficulty although she has a poor appetite.  No change to bowel or bladder function.  No diarrhea.  No dysuria. No alleviating or exacerbating factors reported.  No chest pain, dyspnea.  The history is provided by the patient. No language interpreter was used.  Cough Associated symptoms: chills, fever and rhinorrhea   Associated symptoms: no chest pain, no eye discharge, no headaches, no rash and no shortness of breath       Past Medical History:  Diagnosis Date   Anxiety    Asthma    Eczema    Esophageal stricture     Patient Active Problem List   Diagnosis Date Noted   Sleepiness 09/19/2020   Seasonal and perennial allergic rhinitis 09/19/2020   Allergic asthma, mild intermittent, uncomplicated    GERD (gastroesophageal reflux disease)    Mid back pain 11/11/2016   Esophageal stricture     History reviewed. No pertinent surgical history.   OB History   No obstetric history on file.     History reviewed. No pertinent family history.  Social History   Tobacco Use   Smoking status: Never   Smokeless tobacco: Never  Vaping Use   Vaping Use: Never used  Substance Use Topics   Alcohol use: No   Drug use: No    Home Medications Prior to Admission medications   Medication Sig Start Date End Date Taking? Authorizing Provider   acetaminophen (TYLENOL CHILDRENS) 160 MG/5ML suspension Take 10 mLs (320 mg total) by mouth every 6 (six) hours as needed. 01/28/21  Yes Sloan Leiter, DO  dextromethorphan (DELSYM) 30 MG/5ML liquid Take 2.5 mLs (15 mg total) by mouth as needed for cough. 01/28/21  Yes Tanda Rockers A, DO  fluticasone (FLONASE) 50 MCG/ACT nasal spray Place 1 spray into both nostrils daily for 7 days. 01/28/21 02/04/21 Yes Tanda Rockers A, DO  ibuprofen (ADVIL) 100 MG/5ML suspension Take 30 mLs (600 mg total) by mouth every 4 (four) hours as needed for up to 5 days. 01/28/21 02/02/21 Yes Tanda Rockers A, DO  ondansetron (ZOFRAN) 4 MG tablet Take 1 tablet (4 mg total) by mouth every 8 (eight) hours as needed for nausea or vomiting. 01/28/21  Yes Tanda Rockers A, DO  sucralfate (CARAFATE) 1 g tablet Take 1 tablet (1 g total) by mouth 4 (four) times daily -  with meals and at bedtime for 5 days. 01/28/21 02/02/21 Yes Tanda Rockers A, DO  albuterol (PROVENTIL) (2.5 MG/3ML) 0.083% nebulizer solution Take 3 mLs (2.5 mg total) by nebulization every 4 (four) hours as needed for wheezing or shortness of breath. 02/13/19   Maxwell Caul, PA-C  albuterol (VENTOLIN HFA) 108 (90 Base) MCG/ACT inhaler Inhale 2 puffs into the lungs every 4 (four) hours as needed for shortness of breath. 02/13/19   Maxwell Caul,  PA-C  B Complex Vitamins (VITAMIN B COMPLEX PO) Take 1 tablet by mouth daily.    [provider]  benzonatate (TESSALON) 100 MG capsule Take 1-2 capsules (100-200 mg total) by mouth 3 (three) times daily as needed for cough. 11/23/19   Wallis Bamberg, PA-C  cetirizine (ZYRTEC ALLERGY) 10 MG tablet Take 1 tablet (10 mg total) by mouth daily. 07/24/20   Wallis Bamberg, PA-C  fluticasone (FLONASE) 50 MCG/ACT nasal spray Place 1 spray into both nostrils daily. 02/13/19   Graciella Freer A, PA-C  ipratropium (ATROVENT) 0.06 % nasal spray Place 1 spray into both nostrils every 6 (six) hours as needed for rhinitis.  12/22/18    [provider]  modafinil (PROVIGIL) 100 MG tablet Take 1 tablet (100 mg total) by mouth daily. Patient not taking: Reported on 10/24/2020 09/19/20   Waymon Budge, MD  promethazine-dextromethorphan (PROMETHAZINE-DM) 6.25-15 MG/5ML syrup Take 5 mLs by mouth at bedtime as needed for cough. 07/24/20   Wallis Bamberg, PA-C  pseudoephedrine (SUDAFED) 30 MG tablet Take 1 tablet (30 mg total) by mouth every 8 (eight) hours as needed for congestion. 07/24/20   Wallis Bamberg, PA-C  rizatriptan (MAXALT) 10 MG tablet Take 1 tablet earliest onset of migraine.  May repeat in 2 hours if needed.  Maximum 2 tablets in 24 hours. 02/09/19   Drema Dallas, DO  zolpidem (AMBIEN) 5 MG tablet 1 at bedtime for sleep Patient not taking: Reported on 10/24/2020 09/19/20   Waymon Budge, MD  nortriptyline (PAMELOR) 10 MG capsule Take 1 capsule at bedtime for a week, then increase to 2 capsules at bedtime. 02/09/19 11/23/19  Drema Dallas, DO    Allergies    Bee venom, Kiwi extract, Mucinex clear & cool day-night, and Pineapple  Review of Systems   Review of Systems  Constitutional:  Positive for chills, fatigue and fever. Negative for activity change.  HENT:  Positive for congestion, postnasal drip and rhinorrhea. Negative for facial swelling and trouble swallowing.   Eyes:  Negative for discharge and redness.  Respiratory:  Positive for cough. Negative for shortness of breath.   Cardiovascular:  Negative for chest pain and palpitations.  Gastrointestinal:  Positive for nausea and vomiting. Negative for abdominal pain and diarrhea.  Genitourinary:  Negative for dysuria and flank pain.  Musculoskeletal:  Negative for back pain and gait problem.  Skin:  Negative for pallor and rash.  Neurological:  Negative for syncope and headaches.   Physical Exam Updated Vital Signs BP 117/72 (BP Location: Right Arm)   Pulse (!) 101   Temp 100 F (37.8 C)   Resp 18   Ht 5\' 10"  (1.778 m)   Wt 59.9 kg   LMP 01/28/2021  Comment: pt states NCP  SpO2 98%   BMI 18.94 kg/m   Physical Exam Vitals and nursing note reviewed.  Constitutional:      General: She is not in acute distress.    Appearance: Normal appearance.  HENT:     Head: Normocephalic and atraumatic.     Right Ear: External ear normal.     Left Ear: External ear normal.     Nose: Nose normal.     Mouth/Throat:     Mouth: Mucous membranes are moist.  Eyes:     General: No scleral icterus.       Right eye: No discharge.        Left eye: No discharge.  Cardiovascular:     Rate and Rhythm: Regular  rhythm. Tachycardia present.     Pulses: Normal pulses.     Heart sounds: Normal heart sounds.  Pulmonary:     Effort: Pulmonary effort is normal. No respiratory distress.     Breath sounds: Normal breath sounds.  Abdominal:     General: Abdomen is flat.     Tenderness: There is no abdominal tenderness.  Musculoskeletal:        General: Normal range of motion.     Cervical back: Normal range of motion.     Right lower leg: No edema.     Left lower leg: No edema.  Skin:    General: Skin is warm and dry.     Capillary Refill: Capillary refill takes less than 2 seconds.  Neurological:     Mental Status: She is alert.  Psychiatric:        Mood and Affect: Mood normal.        Behavior: Behavior normal.    ED Results / Procedures / Treatments   Labs (all labs ordered are listed, but only abnormal results are displayed) Labs Reviewed  RESP PANEL BY RT-PCR (FLU A&B, COVID) ARPGX2 - Abnormal; Notable for the following components:      Result Value   Influenza A by PCR POSITIVE (*)    All other components within normal limits    EKG EKG Interpretation  Date/Time:  Sunday January 28 2021 12:02:27 EST Ventricular Rate:  122 PR Interval:  128 QRS Duration: 74 QT Interval:  292 QTC Calculation: 416 R Axis:   98 Text Interpretation: Sinus tachycardia Right atrial enlargement Confirmed by Tanda Rockers ([id number]) on 01/28/2021 3:55:02  PM  Radiology DG Chest 2 View  Result Date: 01/28/2021 CLINICAL DATA:  Cough, congestion.  Chest pain. EXAM: CHEST - 2 VIEW COMPARISON:  None. FINDINGS: The heart size and mediastinal contours are within normal limits. Both lungs are clear. The visualized skeletal structures are unremarkable. IMPRESSION: No active cardiopulmonary disease. Electronically Signed   By: Larose Hires D.O.   On: 01/28/2021 13:18    Procedures Procedures   Medications Ordered in ED Medications  ibuprofen (ADVIL) 100 MG/5ML suspension 600 mg (600 mg Oral Given 01/28/21 1538)    ED Course  I have reviewed the triage vital signs and the nursing notes.  Pertinent labs & imaging results that were available during my care of the patient were reviewed by me and considered in my medical decision making (see chart for details).    MDM Rules/Calculators/A&P                            CC: uri s/s  This patient complains of above; this involves an extensive number of treatment options and is a complaint that carries with it a high risk of complications and morbidity. Vital signs were reviewed. Serious etiologies considered.  Record review:  Previous records obtained and reviewed    Work up as above, notable for:  Labs & imaging results that were available during my care of the patient were reviewed by me and considered in my medical decision making.   I ordered imaging studies which included CXR and I independently visualized and interpreted imaging which showed no acute process.   Flu testing positive.  Given motrin, reports feeling better after this. Also tolerating oral intake w/o difficulty.  Patient symptoms likely secondary to influenza.  She is outside window of treatment for Tamiflu.  She is not vaccinated.  Discussed supportive  care at home.  Return precautions.  The patient improved significantly and was discharged in stable condition. Detailed discussions were had with the patient regarding  current findings, and need for close f/u with PCP or on call doctor. The patient has been instructed to return immediately if the symptoms worsen in any way for re-evaluation. Patient verbalized understanding and is in agreement with current care plan. All questions answered prior to discharge.          This chart was dictated using voice recognition software.  Despite best efforts to proofread,  errors can occur which can change the documentation meaning.  Final Clinical Impression(s) / ED Diagnoses Final diagnoses:  Influenza A    Rx / DC Orders ED Discharge Orders          Ordered    ibuprofen (ADVIL) 100 MG/5ML suspension  Every 4 hours PRN        01/28/21 1600    acetaminophen (TYLENOL CHILDRENS) 160 MG/5ML suspension  Every 6 hours PRN        01/28/21 1600    fluticasone (FLONASE) 50 MCG/ACT nasal spray  Daily        01/28/21 1600    ondansetron (ZOFRAN) 4 MG tablet  Every 8 hours PRN        01/28/21 1600    sucralfate (CARAFATE) 1 g tablet  3 times daily with meals & bedtime        01/28/21 1600    dextromethorphan (DELSYM) 30 MG/5ML liquid  As needed        01/28/21 1600             Sloan Leiter, DO 01/28/21 1601

## 2021-01-28 NOTE — ED Triage Notes (Signed)
Pt presents to ED POV. Pt c/o productive cough, sore throat, sweating and cold chills x3d. Pt reports being around someone sick. Reports

## 2021-02-08 ENCOUNTER — Other Ambulatory Visit: Payer: Self-pay

## 2021-02-08 ENCOUNTER — Emergency Department (HOSPITAL_COMMUNITY)
Admission: EM | Admit: 2021-02-08 | Discharge: 2021-02-08 | Disposition: A | Discharge status: Home / Self Care | Payer: Medicaid Other | Attending: Emergency Medicine | Admitting: Emergency Medicine

## 2021-02-08 ENCOUNTER — Encounter (HOSPITAL_COMMUNITY): Payer: Self-pay | Admitting: *Deleted

## 2021-02-08 DIAGNOSIS — J45909 Unspecified asthma, uncomplicated: Secondary | ICD-10-CM | POA: Diagnosis not present

## 2021-02-08 DIAGNOSIS — S0990XA Unspecified injury of head, initial encounter: Secondary | ICD-10-CM | POA: Diagnosis present

## 2021-02-08 DIAGNOSIS — S1081XA Abrasion of other specified part of neck, initial encounter: Secondary | ICD-10-CM | POA: Diagnosis not present

## 2021-02-08 MED ORDER — LIDOCAINE 5 % EX PTCH
1.0000 | MEDICATED_PATCH | CUTANEOUS | Status: DC
  Administered 2021-02-08: 06:00:00 1 via TRANSDERMAL
  Filled 2021-02-08 (×2): qty 1
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
## 2021-04-04 ENCOUNTER — Encounter: Payer: Self-pay | Admitting: *Deleted

## 2021-04-11 ENCOUNTER — Emergency Department (HOSPITAL_BASED_OUTPATIENT_CLINIC_OR_DEPARTMENT_OTHER)
Admission: EM | Admit: 2021-04-11 | Discharge: 2021-04-11 | Disposition: A | Discharge status: Home / Self Care | Payer: Medicaid Other | Attending: Emergency Medicine | Admitting: Emergency Medicine

## 2021-04-11 ENCOUNTER — Emergency Department (HOSPITAL_BASED_OUTPATIENT_CLINIC_OR_DEPARTMENT_OTHER): Payer: Medicaid Other

## 2021-04-11 ENCOUNTER — Other Ambulatory Visit: Payer: Self-pay

## 2021-04-11 ENCOUNTER — Encounter (HOSPITAL_BASED_OUTPATIENT_CLINIC_OR_DEPARTMENT_OTHER): Payer: Self-pay | Admitting: Urology

## 2021-04-11 DIAGNOSIS — X58XXXA Exposure to other specified factors, initial encounter: Secondary | ICD-10-CM | POA: Insufficient documentation

## 2021-04-11 DIAGNOSIS — S00521A Blister (nonthermal) of lip, initial encounter: Secondary | ICD-10-CM | POA: Insufficient documentation

## 2021-04-11 DIAGNOSIS — M79604 Pain in right leg: Secondary | ICD-10-CM | POA: Diagnosis not present

## 2021-04-11 MED ORDER — CEPHALEXIN 250 MG PO CAPS
500.0000 mg | ORAL_CAPSULE | Freq: Once | ORAL | Status: AC
  Administered 2021-04-11: 500 mg via ORAL
  Filled 2021-04-11: qty 2

## 2021-04-11 MED ORDER — CEPHALEXIN 250 MG/5ML PO SUSR: 500.0000 mg | Freq: Two times a day (BID) | ORAL | 0 refills | Status: AC

## 2021-04-11 NOTE — ED Provider Notes (Signed)
[facility] EMERGENCY DEPT Provider Note   CSN: [id number] Arrival date & time: 04/11/21  1811     History  Chief Complaint  Patient presents with   Leg Swelling    Autumn Aguilar is a 21 y.o. female presented emerged part with right leg pain and swelling for the past 2 to 3 days.  She says she recently returned from a beach trip.  She does have several environmental allergies and food allergies.  She noted about 2 days ago that she had pain on the right upper posterior thigh, and a streaking redness, which both the pain and the redness appeared to extend lower towards her leg.  Over the past 2 days she feels that the pain is traveled down the back of her leg and out towards her foot.  It is worse with ambulation.  She also noted some small blistering and swelling of her upper lip.  No hives.  No history of DVT or PE.  Not on birth control.  HPI     Home Medications Prior to Admission medications   Medication Sig Start Date End Date Taking? Authorizing Provider  cephALEXin (KEFLEX) 250 MG/5ML suspension Take 10 mLs (500 mg total) by mouth 2 (two) times daily for 5 days. 04/12/21 04/17/21 Yes Tulani Kidney, Kermit Balo, MD  acetaminophen (TYLENOL CHILDRENS) 160 MG/5ML suspension Take 10 mLs (320 mg total) by mouth every 6 (six) hours as needed. 01/28/21   Tanda Rockers A, DO  albuterol (PROVENTIL) (2.5 MG/3ML) 0.083% nebulizer solution Take 3 mLs (2.5 mg total) by nebulization every 4 (four) hours as needed for wheezing or shortness of breath. 02/13/19   Maxwell Caul, PA-C  albuterol (VENTOLIN HFA) 108 (90 Base) MCG/ACT inhaler Inhale 2 puffs into the lungs every 4 (four) hours as needed for shortness of breath. 02/13/19   Graciella Freer A, PA-C  B Complex Vitamins (VITAMIN B COMPLEX PO) Take 1 tablet by mouth daily.    [provider]  benzonatate (TESSALON) 100 MG capsule Take 1-2 capsules (100-200 mg total) by mouth 3 (three) times daily as needed for cough. 11/23/19    Wallis Bamberg, PA-C  cetirizine (ZYRTEC ALLERGY) 10 MG tablet Take 1 tablet (10 mg total) by mouth daily. 07/24/20   Wallis Bamberg, PA-C  dextromethorphan (DELSYM) 30 MG/5ML liquid Take 2.5 mLs (15 mg total) by mouth as needed for cough. 01/28/21   Sloan Leiter, DO  fluticasone (FLONASE) 50 MCG/ACT nasal spray Place 1 spray into both nostrils daily. 02/13/19   Maxwell Caul, PA-C  fluticasone (FLONASE) 50 MCG/ACT nasal spray Place 1 spray into both nostrils daily for 7 days. 01/28/21 02/04/21  Tanda Rockers A, DO  ipratropium (ATROVENT) 0.06 % nasal spray Place 1 spray into both nostrils every 6 (six) hours as needed for rhinitis.  12/22/18   [provider]  modafinil (PROVIGIL) 100 MG tablet Take 1 tablet (100 mg total) by mouth daily. Patient not taking: Reported on 10/24/2020 09/19/20   Waymon Budge, MD  ondansetron (ZOFRAN) 4 MG tablet Take 1 tablet (4 mg total) by mouth every 8 (eight) hours as needed for nausea or vomiting. 01/28/21   Sloan Leiter, DO  promethazine-dextromethorphan (PROMETHAZINE-DM) 6.25-15 MG/5ML syrup Take 5 mLs by mouth at bedtime as needed for cough. 07/24/20   Wallis Bamberg, PA-C  pseudoephedrine (SUDAFED) 30 MG tablet Take 1 tablet (30 mg total) by mouth every 8 (eight) hours as needed for congestion. 07/24/20   Wallis Bamberg, PA-C  rizatriptan (  MAXALT) 10 MG tablet Take 1 tablet earliest onset of migraine.  May repeat in 2 hours if needed.  Maximum 2 tablets in 24 hours. 02/09/19   Drema [place], DO  sucralfate (CARAFATE) 1 g tablet Take 1 tablet (1 g total) by mouth 4 (four) times daily -  with meals and at bedtime for 5 days. 01/28/21 02/02/21  Sloan Leiter, DO  zolpidem (AMBIEN) 5 MG tablet 1 at bedtime for sleep Patient not taking: Reported on 10/24/2020 09/19/20   Waymon Budge, MD  nortriptyline (PAMELOR) 10 MG capsule Take 1 capsule at bedtime for a week, then increase to 2 capsules at bedtime. 02/09/19 11/23/19  Drema [place], DO      Allergies    Bee  venom, Kiwi extract, Mucinex clear & cool day-night, and Pineapple    Review of Systems   Review of Systems  Physical Exam Updated Vital Signs BP 108/61 (BP Location: Left Arm)    Pulse 76    Temp 98.2 F (36.8 C) (Oral)    Resp 18    Ht 5\' 10"  (1.778 m)    Wt 59 kg    LMP 03/23/2021 (Exact Date)    SpO2 98%    BMI 18.66 kg/m  Physical Exam Constitutional:      General: She is not in acute distress. HENT:     Head: Normocephalic and atraumatic.  Eyes:     Conjunctiva/sclera: Conjunctivae normal.     Pupils: Pupils are equal, round, and reactive to light.  Cardiovascular:     Rate and Rhythm: Normal rate and regular rhythm.  Pulmonary:     Effort: Pulmonary effort is normal. No respiratory distress.  Musculoskeletal:     Comments: Posterior upper right thigh tenderness and posterior popliteal tenderness, very mild swelling of the right lower extremity compared to the left, some small streaking erythema the inside of the right thigh  Skin:    General: Skin is warm and dry.  Neurological:     General: No focal deficit present.     Mental Status: She is alert. Mental status is at baseline.  Psychiatric:        Mood and Affect: Mood normal.        Behavior: Behavior normal.    ED Results / Procedures / Treatments   Labs (all labs ordered are listed, but only abnormal results are displayed) Labs Reviewed - No data to display  EKG None  Radiology 05/21/2021 Venous Img Lower Right (DVT Study)  Result Date: 04/11/2021 CLINICAL DATA:  Right leg and foot swelling for 5 days. EXAM: RIGHT LOWER EXTREMITY VENOUS DOPPLER ULTRASOUND TECHNIQUE: Gray-scale sonography with compression, as well as color and duplex ultrasound, were performed to evaluate the deep venous system(s) from the level of the common femoral vein through the popliteal and proximal calf veins. COMPARISON:  None. FINDINGS: VENOUS Normal compressibility of the common femoral, superficial femoral, and popliteal veins, as well as  the visualized calf veins. Visualized portions of profunda femoral vein and great saphenous vein unremarkable. No filling defects to suggest DVT on grayscale or color Doppler imaging. Doppler waveforms show normal direction of venous flow, normal respiratory plasticity and response to augmentation. Limited views of the contralateral common femoral vein are unremarkable. OTHER None. Limitations: none IMPRESSION: Negative. Electronically Signed   By: 04/13/2021 M.D.   On: 04/11/2021 22:49    Procedures Procedures    Medications Ordered in ED Medications  cephALEXin (KEFLEX) capsule 500 mg (500 mg  Oral Given 04/11/21 2341)    ED Course/ Medical Decision Making/ A&P                           Medical Decision Making Amount and/or Complexity of Data Reviewed ECG/medicine tests: ordered.  Risk Prescription drug management.   Differential diagnoses include DVT versus cellulitis versus allergic or reaction environmental reaction versus other  Vascular ultrasound was ordered and personally reviewed and interpreted showing no acute occlusion or evidence of DVT.  I think is reasonable to treat with a short course 5 days of Keflex for possible cellulitis.  She otherwise is well-appearing and safe for discharge.  Doubt necrotizing fasciitis or compartment syndrome.        Final Clinical Impression(s) / ED Diagnoses Final diagnoses:  Right leg pain    Rx / DC Orders ED Discharge Orders          Ordered    cephALEXin (KEFLEX) 250 MG/5ML suspension  2 times daily        04/11/21 2338              Terald Sleeper, MD 04/11/21 [phone]

## 2021-04-11 NOTE — Discharge Instructions (Addendum)
Please follow-up with your doctor this week if your symptoms persist.  We do not see signs of blood clots on your DVT study.  It is possible that you may have an infection or skin infection in your leg, and prescribed you 5 days of an antibiotic

## 2021-04-11 NOTE — ED Triage Notes (Signed)
Right upper leg swelling and pain since Saturday, pain with wt bearing Denies any redness  States pain radiating to right foot with right foot swelling   Also states small bumps to upper lip x 2 days

## 2021-07-17 ENCOUNTER — Encounter (HOSPITAL_BASED_OUTPATIENT_CLINIC_OR_DEPARTMENT_OTHER): Payer: Self-pay | Admitting: Obstetrics and Gynecology

## 2021-07-17 ENCOUNTER — Other Ambulatory Visit: Payer: Self-pay

## 2021-07-17 ENCOUNTER — Emergency Department (HOSPITAL_BASED_OUTPATIENT_CLINIC_OR_DEPARTMENT_OTHER)
Admission: EM | Admit: 2021-07-17 | Discharge: 2021-07-17 | Disposition: A | Discharge status: Home / Self Care | Payer: Medicaid Other | Attending: Emergency Medicine | Admitting: Emergency Medicine

## 2021-07-17 DIAGNOSIS — Y92410 Unspecified street and highway as the place of occurrence of the external cause: Secondary | ICD-10-CM | POA: Insufficient documentation

## 2021-07-17 DIAGNOSIS — Z79899 Other long term (current) drug therapy: Secondary | ICD-10-CM | POA: Insufficient documentation

## 2021-07-17 DIAGNOSIS — S0990XA Unspecified injury of head, initial encounter: Secondary | ICD-10-CM | POA: Diagnosis present

## 2021-07-17 MED ORDER — IBUPROFEN 800 MG PO TABS
800.0000 mg | ORAL_TABLET | Freq: Once | ORAL | Status: AC
  Administered 2021-07-17: 800 mg via ORAL
  Filled 2021-07-17: qty 1

## 2021-07-17 NOTE — ED Notes (Signed)
Reviewed AVS/discharge instruction with patient. Time allotted for and all questions answered. Patient is agreeable for d/c and escorted to ed exit by staff.  
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
## 2021-08-23 ENCOUNTER — Ambulatory Visit (HOSPITAL_BASED_OUTPATIENT_CLINIC_OR_DEPARTMENT_OTHER): Payer: Medicaid Other | Admitting: Nurse Practitioner

## 2021-08-30 ENCOUNTER — Emergency Department (HOSPITAL_BASED_OUTPATIENT_CLINIC_OR_DEPARTMENT_OTHER)
Admission: EM | Admit: 2021-08-30 | Discharge: 2021-08-30 | Disposition: A | Discharge status: Home / Self Care | Payer: Medicaid Other | Attending: Emergency Medicine | Admitting: Emergency Medicine

## 2021-08-30 ENCOUNTER — Other Ambulatory Visit: Payer: Self-pay

## 2021-08-30 ENCOUNTER — Encounter (HOSPITAL_BASED_OUTPATIENT_CLINIC_OR_DEPARTMENT_OTHER): Payer: Self-pay | Admitting: Emergency Medicine

## 2021-08-30 ENCOUNTER — Emergency Department (HOSPITAL_BASED_OUTPATIENT_CLINIC_OR_DEPARTMENT_OTHER): Payer: Medicaid Other

## 2021-08-30 DIAGNOSIS — Z79899 Other long term (current) drug therapy: Secondary | ICD-10-CM | POA: Insufficient documentation

## 2021-08-30 DIAGNOSIS — R109 Unspecified abdominal pain: Secondary | ICD-10-CM | POA: Diagnosis present

## 2021-08-30 LAB — COMPREHENSIVE METABOLIC PANEL
ALT: 9 U/L (ref 0–44)
AST: 12 U/L — ABNORMAL LOW (ref 15–41)
Albumin: 4.7 g/dL (ref 3.5–5.0)
Alkaline Phosphatase: 37 U/L — ABNORMAL LOW (ref 38–126)
Anion gap: 10 (ref 5–15)
BUN: 8 mg/dL (ref 6–20)
CO2: 23 mmol/L (ref 22–32)
Calcium: 9.7 mg/dL (ref 8.9–10.3)
Chloride: 103 mmol/L (ref 98–111)
Creatinine, Ser: 0.83 mg/dL (ref 0.44–1.00)
GFR, Estimated: >60 mL/min (ref >60)
Glucose, Bld: 98 mg/dL (ref 70–99)
Potassium: 3.5 mmol/L (ref 3.5–5.1)
Sodium: 136 mmol/L (ref 135–145)
Total Bilirubin: 0.3 mg/dL (ref 0.3–1.2)
Total Protein: 8 g/dL (ref 6.5–8.1)

## 2021-08-30 LAB — URINALYSIS, ROUTINE W REFLEX MICROSCOPIC
Bilirubin Urine: NEGATIVE
Glucose, UA: NEGATIVE mg/dL
Ketones, ur: NEGATIVE mg/dL
Leukocytes,Ua: NEGATIVE
Nitrite: NEGATIVE
Specific Gravity, Urine: 1.019 (ref 1.005–1.030)
pH: 6 (ref 5.0–8.0)

## 2021-08-30 LAB — LIPASE, BLOOD: Lipase: 44 U/L (ref 11–51)

## 2021-08-30 LAB — CBC
HCT: 40.8 % (ref 36.0–46.0)
Hemoglobin: 14.4 g/dL (ref 12.0–15.0)
MCH: 31.6 pg (ref 26.0–34.0)
MCHC: 35.3 g/dL (ref 30.0–36.0)
MCV: 89.7 fL (ref 80.0–100.0)
Platelets: 240 10*3/uL (ref 150–400)
RBC: 4.55 MIL/uL (ref 3.87–5.11)
RDW: 12.1 % (ref 11.5–15.5)
WBC: 9.5 10*3/uL (ref 4.0–10.5)
nRBC: 0 % (ref 0.0–0.2)

## 2021-08-30 LAB — PREGNANCY, URINE: Preg Test, Ur: NEGATIVE

## 2021-08-30 MED ORDER — ACETAMINOPHEN 500 MG PO TABS
1000.0000 mg | ORAL_TABLET | Freq: Once | ORAL | Status: AC
  Administered 2021-08-30: 1000 mg via ORAL
  Filled 2021-08-30: qty 2

## 2021-08-30 NOTE — ED Triage Notes (Signed)
Strating about 1.5 hour ago, had sharp stabbing pain in left side abdo lasting greater than 15 mins. Now subsided but having intermittent sharp stabbing pains since. Took ibuprofen around 6pm with no relief

## 2021-08-30 NOTE — Discharge Instructions (Signed)
It was our pleasure to provide your ER care today - we hope that you feel better.  Drink plenty of fluids/stay well hydrated.  Take acetaminophen or ibuprofen as need for pain.  Follow up with primary care doctor in the next couple days for recheck if symptoms fail to improve/resolve.  Return to ER if worse, new symptoms, fevers, severe pain, persistent vomiting, or other concern.

## 2021-08-30 NOTE — ED Provider Notes (Signed)
[place] EMERGENCY DEPT Provider Note   CSN: [id number] Arrival date & time: 08/30/21  2020     History  Chief Complaint  Patient presents with   Abdominal Pain    Autumn Aguilar is a 21 y.o. female.  Patient c/o acute onset left flank pain tonight, at rest. Dull pain, waxing/waning, left flank. No hx same pain. No nausea or vomiting. No fever or chills. No dysuria or hematuria. Is on period, denies any abnormal bleeding or discharge. No hx ovarian cyst. Having normal bms.   The history is provided by the patient and medical records.  Abdominal Pain Associated symptoms: no chest pain, no chills, no cough, no dysuria, no fever, no shortness of breath, no vaginal discharge and no vomiting        Home Medications Prior to Admission medications   Medication Sig Start Date End Date Taking? Authorizing Provider  acetaminophen (TYLENOL CHILDRENS) 160 MG/5ML suspension Take 10 mLs (320 mg total) by mouth every 6 (six) hours as needed. 01/28/21   Wynona Dove A, DO  albuterol (PROVENTIL) (2.5 MG/3ML) 0.083% nebulizer solution Take 3 mLs (2.5 mg total) by nebulization every 4 (four) hours as needed for wheezing or shortness of breath. 02/13/19   Volanda Napoleon, PA-C  albuterol (VENTOLIN HFA) 108 (90 Base) MCG/ACT inhaler Inhale 2 puffs into the lungs every 4 (four) hours as needed for shortness of breath. 02/13/19   Providence Lanius A, PA-C  B Complex Vitamins (VITAMIN B COMPLEX PO) Take 1 tablet by mouth daily.    [provider]  benzonatate (TESSALON) 100 MG capsule Take 1-2 capsules (100-200 mg total) by mouth 3 (three) times daily as needed for cough. 11/23/19   Jaynee Eagles, PA-C  cetirizine (ZYRTEC ALLERGY) 10 MG tablet Take 1 tablet (10 mg total) by mouth daily. 07/24/20   Jaynee Eagles, PA-C  dextromethorphan (DELSYM) 30 MG/5ML liquid Take 2.5 mLs (15 mg total) by mouth as needed for cough. 01/28/21   Jeanell Sparrow, DO  fluticasone (FLONASE) 50 MCG/ACT  nasal spray Place 1 spray into both nostrils daily. 02/13/19   Volanda Napoleon, PA-C  fluticasone (FLONASE) 50 MCG/ACT nasal spray Place 1 spray into both nostrils daily for 7 days. 01/28/21 02/04/21  Wynona Dove A, DO  ipratropium (ATROVENT) 0.06 % nasal spray Place 1 spray into both nostrils every 6 (six) hours as needed for rhinitis.  12/22/18   [provider]  modafinil (PROVIGIL) 100 MG tablet Take 1 tablet (100 mg total) by mouth daily. Patient not taking: Reported on 10/24/2020 09/19/20   Deneise Lever, MD  ondansetron (ZOFRAN) 4 MG tablet Take 1 tablet (4 mg total) by mouth every 8 (eight) hours as needed for nausea or vomiting. 01/28/21   Jeanell Sparrow, DO  promethazine-dextromethorphan (PROMETHAZINE-DM) 6.25-15 MG/5ML syrup Take 5 mLs by mouth at bedtime as needed for cough. 07/24/20   Jaynee Eagles, PA-C  pseudoephedrine (SUDAFED) 30 MG tablet Take 1 tablet (30 mg total) by mouth every 8 (eight) hours as needed for congestion. 07/24/20   Jaynee Eagles, PA-C  rizatriptan (MAXALT) 10 MG tablet Take 1 tablet earliest onset of migraine.  May repeat in 2 hours if needed.  Maximum 2 tablets in 24 hours. 02/09/19   Pieter Partridge, DO  sucralfate (CARAFATE) 1 g tablet Take 1 tablet (1 g total) by mouth 4 (four) times daily -  with meals and at bedtime for 5 days. 01/28/21 02/02/21  Wynona Dove A, DO  zolpidem (  AMBIEN) 5 MG tablet 1 at bedtime for sleep Patient not taking: Reported on 10/24/2020 09/19/20   Deneise Lever, MD  nortriptyline (PAMELOR) 10 MG capsule Take 1 capsule at bedtime for a week, then increase to 2 capsules at bedtime. 02/09/19 11/23/19  Pieter Partridge, DO      Allergies    Bee venom, Kiwi extract, Mucinex clear & cool day-night, and Pineapple    Review of Systems   Review of Systems  Constitutional:  Negative for chills and fever.  Respiratory:  Negative for cough and shortness of breath.   Cardiovascular:  Negative for chest pain.  Gastrointestinal:  Positive for  abdominal pain. Negative for vomiting.  Genitourinary:  Negative for dysuria, flank pain and vaginal discharge.  Musculoskeletal:  Negative for back pain.  Skin:  Negative for rash.    Physical Exam Updated Vital Signs BP 114/80   Pulse 65   Temp 98.2 F (36.8 C)   Resp 16   Ht 1.778 m (5\' 10" )   SpO2 100%   BMI 17.94 kg/m  Physical Exam Vitals and nursing note reviewed.  Constitutional:      Appearance: Normal appearance. She is well-developed.  HENT:     Head: Atraumatic.     Nose: Nose normal.     Mouth/Throat:     Mouth: Mucous membranes are moist.  Eyes:     General: No scleral icterus.    Conjunctiva/sclera: Conjunctivae normal.  Neck:     Trachea: No tracheal deviation.  Cardiovascular:     Rate and Rhythm: Normal rate and regular rhythm.     Pulses: Normal pulses.     Heart sounds: Normal heart sounds. No murmur heard.    No friction rub. No gallop.  Pulmonary:     Effort: Pulmonary effort is normal. No respiratory distress.     Breath sounds: Normal breath sounds.  Abdominal:     General: Bowel sounds are normal. There is no distension.     Palpations: Abdomen is soft. There is no mass.     Tenderness: There is no abdominal tenderness. There is no guarding.  Genitourinary:    Comments: No cva tenderness.  Musculoskeletal:        General: No swelling.     Cervical back: Normal range of motion and neck supple. No rigidity. No muscular tenderness.  Skin:    General: Skin is warm and dry.     Findings: No rash.  Neurological:     Mental Status: She is alert.     Comments: Alert, speech normal.   Psychiatric:        Mood and Affect: Mood normal.     ED Results / Procedures / Treatments   Labs (all labs ordered are listed, but only abnormal results are displayed) Results for orders placed or performed during the hospital encounter of 08/30/21  Lipase, blood  Result Value Ref Range   Lipase 44 11 - 51 U/L  Comprehensive metabolic panel  Result  Value Ref Range   Sodium 136 135 - 145 mmol/L   Potassium 3.5 3.5 - 5.1 mmol/L   Chloride 103 98 - 111 mmol/L   CO2 23 22 - 32 mmol/L   Glucose, Bld 98 70 - 99 mg/dL   BUN 8 6 - 20 mg/dL   Creatinine, Ser 0.83 0.44 - 1.00 mg/dL   Calcium 9.7 8.9 - 10.3 mg/dL   Total Protein 8.0 6.5 - 8.1 g/dL   Albumin 4.7 3.5 - 5.0 g/dL  AST 12 (L) 15 - 41 U/L   ALT 9 0 - 44 U/L   Alkaline Phosphatase 37 (L) 38 - 126 U/L   Total Bilirubin 0.3 0.3 - 1.2 mg/dL   GFR, Estimated >25 >85 mL/min   Anion gap 10 5 - 15  CBC  Result Value Ref Range   WBC 9.5 4.0 - 10.5 K/uL   RBC 4.55 3.87 - 5.11 MIL/uL   Hemoglobin 14.4 12.0 - 15.0 g/dL   HCT 27.7 82.4 - 23.5 %   MCV 89.7 80.0 - 100.0 fL   MCH 31.6 26.0 - 34.0 pg   MCHC 35.3 30.0 - 36.0 g/dL   RDW 36.1 44.3 - 15.4 %   Platelets 240 150 - 400 K/uL   nRBC 0.0 0.0 - 0.2 %  Urinalysis, Routine w reflex microscopic  Result Value Ref Range   Color, Urine YELLOW YELLOW   APPearance HAZY (A) CLEAR   Specific Gravity, Urine 1.019 1.005 - 1.030   pH 6.0 5.0 - 8.0   Glucose, UA NEGATIVE NEGATIVE mg/dL   Hgb urine dipstick LARGE (A) NEGATIVE   Bilirubin Urine NEGATIVE NEGATIVE   Ketones, ur NEGATIVE NEGATIVE mg/dL   Protein, ur TRACE (A) NEGATIVE mg/dL   Nitrite NEGATIVE NEGATIVE   Leukocytes,Ua NEGATIVE NEGATIVE   RBC / HPF 21-50 0 - 5 RBC/hpf   WBC, UA 0-5 0 - 5 WBC/hpf   Squamous Epithelial / LPF 0-5 0 - 5   Mucus PRESENT   Pregnancy, urine  Result Value Ref Range   Preg Test, Ur NEGATIVE NEGATIVE   No results found.   EKG None  Radiology CT Renal Stone Study  Result Date: 08/30/2021 CLINICAL DATA:  Flank pain, kidney stone suspected (pregnant) acute left flank pain EXAM: CT ABDOMEN AND PELVIS WITHOUT CONTRAST TECHNIQUE: Multidetector CT imaging of the abdomen and pelvis was performed following the standard protocol without IV contrast. RADIATION DOSE REDUCTION: This exam was performed according to the departmental dose-optimization  program which includes automated exposure control, adjustment of the mA and/or kV according to patient size and/or use of iterative reconstruction technique. COMPARISON:  None Available. FINDINGS: Lower chest: No acute abnormality Hepatobiliary: No focal hepatic abnormality. Gallbladder unremarkable. Pancreas: No focal abnormality or ductal dilatation. Spleen: No focal abnormality.  Normal size. Adrenals/Urinary Tract: No adrenal abnormality. No focal renal abnormality. No stones or hydronephrosis. Urinary bladder is unremarkable. Stomach/Bowel: Stomach, large and small bowel grossly unremarkable. Normal appendix. Vascular/Lymphatic: No evidence of aneurysm or adenopathy. Reproductive: Uterus and adnexa unremarkable.  No mass. Other: No free fluid or free air. Musculoskeletal: No acute bony abnormality. IMPRESSION: No renal or ureteral stones.  No hydronephrosis. No acute findings. Electronically Signed   By: Charlett Nose M.D.   On: 08/30/2021 23:21    Procedures Procedures    Medications Ordered in ED Medications - No data to display  ED Course/ Medical Decision Making/ A&P                           Medical Decision Making Problems Addressed: Left flank pain: acute illness or injury with systemic symptoms  Amount and/or Complexity of Data Reviewed External Data Reviewed: notes. Labs: ordered. Decision-making details documented in ED Course. Radiology: ordered and independent interpretation performed. Decision-making details documented in ED Course.  Risk OTC drugs.   Iv ns. Continuous pulse ox and cardiac monitoring. Labs ordered/sent. Imaging ordered.   Reviewed nursing notes and prior charts for additional history. External  reports reviewed.  Cardiac monitor: sinus rhythm, rate 66.  Labs reviewed/interpreted by me - preg neg. Ua w some rbc (pt on period). Given acute flank pain, colicky, will get ct.   Acetaminophen po.   CT reviewed/interpreted by me - no ureteral stone.   Pt  notes pain now resolved. Abd soft nt.  Pt appears stable for d/c.  Return precautions provided.            Final Clinical Impression(s) / ED Diagnoses Final diagnoses:  None    Rx / DC Orders ED Discharge Orders     None         Cathren Laine, MD 08/30/21 2328

## 2021-09-07 ENCOUNTER — Encounter (HOSPITAL_BASED_OUTPATIENT_CLINIC_OR_DEPARTMENT_OTHER): Payer: Self-pay | Admitting: Nurse Practitioner

## 2021-12-30 ENCOUNTER — Encounter (HOSPITAL_BASED_OUTPATIENT_CLINIC_OR_DEPARTMENT_OTHER): Payer: Self-pay

## 2021-12-30 ENCOUNTER — Other Ambulatory Visit: Payer: Self-pay

## 2021-12-30 DIAGNOSIS — B354 Tinea corporis: Secondary | ICD-10-CM | POA: Insufficient documentation

## 2021-12-30 DIAGNOSIS — Z20822 Contact with and (suspected) exposure to covid-19: Secondary | ICD-10-CM | POA: Diagnosis not present

## 2021-12-30 DIAGNOSIS — S060X0A Concussion without loss of consciousness, initial encounter: Secondary | ICD-10-CM | POA: Diagnosis not present

## 2021-12-30 DIAGNOSIS — S0990XA Unspecified injury of head, initial encounter: Secondary | ICD-10-CM | POA: Diagnosis present
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
## 2021-12-31 ENCOUNTER — Emergency Department (HOSPITAL_BASED_OUTPATIENT_CLINIC_OR_DEPARTMENT_OTHER)
Admission: EM | Admit: 2021-12-31 | Discharge: 2021-12-31 | Disposition: A | Discharge status: Home / Self Care | Payer: Medicaid Other | Attending: Emergency Medicine | Admitting: Emergency Medicine

## 2021-12-31 ENCOUNTER — Emergency Department (HOSPITAL_BASED_OUTPATIENT_CLINIC_OR_DEPARTMENT_OTHER): Payer: Medicaid Other

## 2021-12-31 DIAGNOSIS — B354 Tinea corporis: Secondary | ICD-10-CM

## 2021-12-31 DIAGNOSIS — S098XXA Other specified injuries of head, initial encounter: Secondary | ICD-10-CM

## 2021-12-31 DIAGNOSIS — S060X0A Concussion without loss of consciousness, initial encounter: Secondary | ICD-10-CM

## 2021-12-31 LAB — RESP PANEL BY RT-PCR (FLU A&B, COVID) ARPGX2
Influenza A by PCR: NEGATIVE
Influenza B by PCR: NEGATIVE
SARS Coronavirus 2 by RT PCR: NEGATIVE

## 2021-12-31 LAB — PREGNANCY, URINE: Preg Test, Ur: NEGATIVE

## 2021-12-31 MED ORDER — CLOTRIMAZOLE 1 % EX CREA: TOPICAL_CREAM | CUTANEOUS | 0 refills | Status: DC

## 2021-12-31 NOTE — ED Notes (Signed)
Patient transported to CT 
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
## 2022-07-23 ENCOUNTER — Emergency Department (HOSPITAL_BASED_OUTPATIENT_CLINIC_OR_DEPARTMENT_OTHER)
Admission: EM | Admit: 2022-07-23 | Discharge: 2022-07-23 | Disposition: A | Discharge status: Home / Self Care | Payer: Medicaid Other | Attending: Emergency Medicine | Admitting: Emergency Medicine

## 2022-07-23 ENCOUNTER — Other Ambulatory Visit: Payer: Self-pay

## 2022-07-23 ENCOUNTER — Encounter (HOSPITAL_BASED_OUTPATIENT_CLINIC_OR_DEPARTMENT_OTHER): Payer: Self-pay | Admitting: *Deleted

## 2022-07-23 ENCOUNTER — Emergency Department (HOSPITAL_BASED_OUTPATIENT_CLINIC_OR_DEPARTMENT_OTHER): Payer: Medicaid Other | Admitting: Radiology

## 2022-07-23 DIAGNOSIS — J029 Acute pharyngitis, unspecified: Secondary | ICD-10-CM | POA: Diagnosis present

## 2022-07-23 DIAGNOSIS — Z1152 Encounter for screening for COVID-19: Secondary | ICD-10-CM | POA: Diagnosis not present

## 2022-07-23 LAB — SARS CORONAVIRUS 2 BY RT PCR: SARS Coronavirus 2 by RT PCR: NEGATIVE

## 2022-07-23 LAB — GROUP A STREP BY PCR: Group A Strep by PCR: NOT DETECTED

## 2022-07-23 MED ORDER — PREDNISONE 10 MG (21) PO TBPK: ORAL_TABLET | ORAL | 0 refills | Status: DC

## 2022-07-23 NOTE — ED Provider Notes (Signed)
[place] EMERGENCY DEPARTMENT AT [facility]  Provider Note  CSN: [id number] Arrival date & time: 07/23/22 4098  History Chief Complaint  Patient presents with   Sore Throat    Autumn Aguilar is a 22 y.o. female with history of tonsillitis reports 3 days of nasal congestion, post-nasal drop and sore throat. She reports she began coughing tonight and was having trouble sleeping. She had a fever yesterday but none today. Has been taking OTC meds with minimal improvement.    Home Medications Prior to Admission medications   Medication Sig Start Date End Date Taking? Authorizing Provider  predniSONE (STERAPRED UNI-PAK 21 TAB) 10 MG (21) TBPK tablet 10mg  Tabs, 6 day taper. Use as directed 07/23/22  Yes Pollyann Savoy, MD  acetaminophen (TYLENOL CHILDRENS) 160 MG/5ML suspension Take 10 mLs (320 mg total) by mouth every 6 (six) hours as needed. 01/28/21   Tanda Rockers A, DO  albuterol (PROVENTIL) (2.5 MG/3ML) 0.083% nebulizer solution Take 3 mLs (2.5 mg total) by nebulization every 4 (four) hours as needed for wheezing or shortness of breath. 02/13/19   Maxwell Caul, PA-C  albuterol (VENTOLIN HFA) 108 (90 Base) MCG/ACT inhaler Inhale 2 puffs into the lungs every 4 (four) hours as needed for shortness of breath. 02/13/19   Graciella Freer A, PA-C  B Complex Vitamins (VITAMIN B COMPLEX PO) Take 1 tablet by mouth daily.    [provider]  benzonatate (TESSALON) 100 MG capsule Take 1-2 capsules (100-200 mg total) by mouth 3 (three) times daily as needed for cough. 11/23/19   Wallis Bamberg, PA-C  cetirizine (ZYRTEC ALLERGY) 10 MG tablet Take 1 tablet (10 mg total) by mouth daily. 07/24/20   Wallis Bamberg, PA-C  clotrimazole (LOTRIMIN) 1 % cream Apply to affected area 2 times daily for 4 weeks or 1 week after skin rash has healed 12/31/21   Edwin Dada P, DO  dextromethorphan (DELSYM) 30 MG/5ML liquid Take 2.5 mLs (15 mg total) by mouth as needed for cough. 01/28/21   Sloan Leiter, DO  fluticasone (FLONASE) 50 MCG/ACT nasal spray Place 1 spray into both nostrils daily. 02/13/19   Maxwell Caul, PA-C  fluticasone (FLONASE) 50 MCG/ACT nasal spray Place 1 spray into both nostrils daily for 7 days. 01/28/21 02/04/21  Tanda Rockers A, DO  ipratropium (ATROVENT) 0.06 % nasal spray Place 1 spray into both nostrils every 6 (six) hours as needed for rhinitis.  12/22/18   [provider]  modafinil (PROVIGIL) 100 MG tablet Take 1 tablet (100 mg total) by mouth daily. Patient not taking: Reported on 10/24/2020 09/19/20   Waymon Budge, MD  ondansetron (ZOFRAN) 4 MG tablet Take 1 tablet (4 mg total) by mouth every 8 (eight) hours as needed for nausea or vomiting. 01/28/21   Sloan Leiter, DO  promethazine-dextromethorphan (PROMETHAZINE-DM) 6.25-15 MG/5ML syrup Take 5 mLs by mouth at bedtime as needed for cough. 07/24/20   Wallis Bamberg, PA-C  pseudoephedrine (SUDAFED) 30 MG tablet Take 1 tablet (30 mg total) by mouth every 8 (eight) hours as needed for congestion. 07/24/20   Wallis Bamberg, PA-C  rizatriptan (MAXALT) 10 MG tablet Take 1 tablet earliest onset of migraine.  May repeat in 2 hours if needed.  Maximum 2 tablets in 24 hours. 02/09/19   Drema Dallas, DO  sucralfate (CARAFATE) 1 g tablet Take 1 tablet (1 g total) by mouth 4 (four) times daily -  with meals and at bedtime for 5 days. 01/28/21  02/02/21  Sloan Leiter, DO  zolpidem (AMBIEN) 5 MG tablet 1 at bedtime for sleep Patient not taking: Reported on 10/24/2020 09/19/20   Waymon Budge, MD  nortriptyline (PAMELOR) 10 MG capsule Take 1 capsule at bedtime for a week, then increase to 2 capsules at bedtime. 02/09/19 11/23/19  Drema Dallas, DO     Allergies    Bee venom, Kiwi extract, Mucinex clear & cool day-night, and Pineapple   Review of Systems   Review of Systems Please see HPI for pertinent positives and negatives  Physical Exam BP 113/72   Pulse 77   Temp 98.7 F (37.1 C) (Oral)   Resp 16   Ht 5'  10" (1.778 m)   Wt 61.2 kg   LMP 07/09/2022   SpO2 99%   BMI 19.37 kg/m   Physical Exam Vitals and nursing note reviewed.  Constitutional:      Appearance: Normal appearance.  HENT:     Head: Normocephalic and atraumatic.     Nose: Nose normal.     Mouth/Throat:     Mouth: Mucous membranes are moist.     Pharynx: Pharyngeal swelling (mild R tonsillar swelling) and posterior oropharyngeal erythema present.     Tonsils: No tonsillar exudate.  Eyes:     Extraocular Movements: Extraocular movements intact.     Conjunctiva/sclera: Conjunctivae normal.  Cardiovascular:     Rate and Rhythm: Normal rate.  Pulmonary:     Effort: Pulmonary effort is normal.     Breath sounds: Normal breath sounds. No wheezing, rhonchi or rales.  Abdominal:     General: Abdomen is flat.     Palpations: Abdomen is soft.     Tenderness: There is no abdominal tenderness.  Musculoskeletal:        General: No swelling. Normal range of motion.     Cervical back: Neck supple.  Lymphadenopathy:     Cervical: No cervical adenopathy.  Skin:    General: Skin is warm and dry.  Neurological:     General: No focal deficit present.     Mental Status: She is alert.  Psychiatric:        Mood and Affect: Mood normal.     ED Results / Procedures / Treatments   EKG None  Procedures Procedures  Medications Ordered in the ED Medications - No data to display  Initial Impression and Plan  Patient here with symptoms most consistent with viral URI, given worsening symptoms will check for Covid and Flu. Will send for CXR to eval PNA.   ED Course   Clinical Course as of 07/23/22 0432  Tue Jul 23, 2022  1610 I personally viewed the images from radiology studies and agree with radiologist interpretation: CXR is clear [CS]  0424 Strep is negative.  [CS]  0429 Covid is negative. Patient continues to have occasional mild cough. Given duration of symptoms will give a course of prednisone to see if that helps.  Otherwise continue with OTC symptom control and supportive care.  [CS]    Clinical Course User Index [CS] Pollyann Savoy, MD     MDM Rules/Calculators/A&P Medical Decision Making Problems Addressed: Viral pharyngitis: acute illness or injury  Amount and/or Complexity of Data Reviewed Labs: ordered. Decision-making details documented in ED Course. Radiology: ordered and independent interpretation performed. Decision-making details documented in ED Course.  Risk Prescription drug management.     Final Clinical Impression(s) / ED Diagnoses Final diagnoses:  Viral pharyngitis    Rx /  DC Orders ED Discharge Orders          Ordered    predniSONE (STERAPRED UNI-PAK 21 TAB) 10 MG (21) TBPK tablet        07/23/22 0431             Pollyann Savoy, MD 07/23/22 [phone]

## 2022-07-23 NOTE — ED Notes (Signed)
Patient to xray.

## 2022-07-23 NOTE — ED Triage Notes (Signed)
Pt reports that since Friday night she has had a sore throat that has become worse. Has had cough and nausea. Fever yesterday. Last had dayquil around 2230

## 2022-09-08 ENCOUNTER — Ambulatory Visit (HOSPITAL_COMMUNITY)
Admission: EM | Admit: 2022-09-08 | Discharge: 2022-09-08 | Disposition: A | Discharge status: Home / Self Care | Payer: Medicaid Other | Attending: Internal Medicine | Admitting: Internal Medicine

## 2022-09-08 ENCOUNTER — Encounter (HOSPITAL_COMMUNITY): Payer: Self-pay

## 2022-09-08 DIAGNOSIS — B3731 Acute candidiasis of vulva and vagina: Secondary | ICD-10-CM | POA: Insufficient documentation

## 2022-09-08 DIAGNOSIS — R102 Pelvic and perineal pain: Secondary | ICD-10-CM | POA: Insufficient documentation

## 2022-09-08 DIAGNOSIS — R1032 Left lower quadrant pain: Secondary | ICD-10-CM | POA: Insufficient documentation

## 2022-09-08 LAB — POCT URINALYSIS DIP (MANUAL ENTRY)
Bilirubin, UA: NEGATIVE
Blood, UA: NEGATIVE
Glucose, UA: NEGATIVE mg/dL
Ketones, POC UA: NEGATIVE mg/dL
Nitrite, UA: NEGATIVE
Protein Ur, POC: NEGATIVE mg/dL
Spec Grav, UA: 1.025 (ref 1.010–1.025)
Urobilinogen, UA: 1 E.U./dL
pH, UA: 7 (ref 5.0–8.0)

## 2022-09-08 LAB — HIV ANTIBODY (ROUTINE TESTING W REFLEX): HIV Screen 4th Generation wRfx: NONREACTIVE

## 2022-09-08 MED ORDER — CLOTRIMAZOLE 1 % EX CREA: TOPICAL_CREAM | CUTANEOUS | 0 refills | Status: AC

## 2022-09-08 MED ORDER — VALACYCLOVIR HCL 1 G PO TABS: 1000.0000 mg | ORAL_TABLET | Freq: Three times a day (TID) | ORAL | 0 refills | Status: AC

## 2022-09-08 NOTE — ED Triage Notes (Addendum)
Pt presents to the office for vaginal swelling and lower abdominal pain.Last sexual intercourse was last night.  Pt states several weeks ago; her body became numb for 40 minutes along with abdominal pain.

## 2022-09-08 NOTE — Discharge Instructions (Addendum)
I am concerned that your vaginal pain may be due to herpes simplex virus (HSV) which is a sexually transmitted viral infection.  I have sent HSV swab off for testing.  I will call you if this is positive. I would like to go ahead and start treatment with Valtrex 3 times a day for the next 7 days.  You may crush the Valtrex tablets and take these with yogurt or applesauce.   Apply clotrimazole cream to the outer vaginal area twice daily for the next 5 to 7 days to treat other rash to the lower vaginal area/buttocks as a suspect that this is likely yeast vaginitis.  The rest of your STD testing will come back in the next 2 to 3 days and we will call you if anything else is abnormal.  If you develop any new or worsening symptoms or if your symptoms do not start to improve, pleases return here or follow-up with your primary care provider. If your symptoms are severe, please go to the emergency room.

## 2022-09-08 NOTE — ED Provider Notes (Signed)
[facility]    CSN: [id number] Arrival date & time: 09/08/22  1241      History   Chief Complaint Chief Complaint  Patient presents with   Groin Swelling   Abdominal Pain    HPI Autumn Aguilar is a 22 y.o. female.   Patient presents to urgent care for evaluation of generalized pain and swelling to the vaginal area as well as abdominal pain/discomfort after sexual intercourse that started after she had unprotected intercourse with her monogamous female partner of 2 years.  Complains of one-sided labia swelling and tenderness after intercourse.  Denies vaginal pain during intercourse and abdominal pain during intercourse but reports afterwards, she felt like her abdomen was cramping and pain made it difficult for her to walk.  Patient reports history of endometriosis with painful menstrual cycles.  Last menstrual cycle started on September 02, 2022 and ended on September 06, 2022.  Denies vaginal itching, odor, and discharge.  No urinary symptoms.  Currently experiencing some left-sided lower abdominal discomfort that comes and goes.  Last normal bowel movement was this morning, no diarrhea or constipation.  No history of PID or frequent history of STDs. Unsure of rash to the vaginal area. She reports history of atypical presentation of BV/vaginal yeast infections and states this feels similar.    Abdominal Pain   Past Medical History:  Diagnosis Date   Anxiety    Asthma    Eczema    Esophageal stricture     Patient Active Problem List   Diagnosis Date Noted   Sleepiness 09/19/2020   Seasonal and perennial allergic rhinitis 09/19/2020   Allergic asthma, mild intermittent, uncomplicated    GERD (gastroesophageal reflux disease)    Mid back pain 11/11/2016   Esophageal stricture     History reviewed. No pertinent surgical history.  OB History     Gravida  0   Para  0   Term  0   Preterm  0   AB  0   Living  0      SAB  0   IAB  0   Ectopic  0    Multiple  0   Live Births  0            Home Medications    Prior to Admission medications   Medication Sig Start Date End Date Taking? Authorizing Provider  clotrimazole (LOTRIMIN) 1 % cream Apply to affected area 2 times daily 09/08/22  Yes Gerry Blanchfield, Donavan Burnet, FNP  valACYclovir (VALTREX) 1000 MG tablet Take 1 tablet (1,000 mg total) by mouth 3 (three) times daily for 7 days. 09/08/22 09/15/22 Yes StanhopeDonavan Burnet, FNP  acetaminophen (TYLENOL CHILDRENS) 160 MG/5ML suspension Take 10 mLs (320 mg total) by mouth every 6 (six) hours as needed. 01/28/21   Tanda Rockers A, DO  albuterol (PROVENTIL) (2.5 MG/3ML) 0.083% nebulizer solution Take 3 mLs (2.5 mg total) by nebulization every 4 (four) hours as needed for wheezing or shortness of breath. 02/13/19   Maxwell Caul, PA-C  albuterol (VENTOLIN HFA) 108 (90 Base) MCG/ACT inhaler Inhale 2 puffs into the lungs every 4 (four) hours as needed for shortness of breath. 02/13/19   Graciella Freer A, PA-C  B Complex Vitamins (VITAMIN B COMPLEX PO) Take 1 tablet by mouth daily.    [provider]  benzonatate (TESSALON) 100 MG capsule Take 1-2 capsules (100-200 mg total) by mouth 3 (three) times daily as needed for cough. 11/23/19   Wallis Bamberg, PA-C  cetirizine (ZYRTEC ALLERGY) 10 MG tablet Take 1 tablet (10 mg total) by mouth daily. 07/24/20   Wallis Bamberg, PA-C  dextromethorphan (DELSYM) 30 MG/5ML liquid Take 2.5 mLs (15 mg total) by mouth as needed for cough. 01/28/21   Sloan Leiter, DO  fluticasone (FLONASE) 50 MCG/ACT nasal spray Place 1 spray into both nostrils daily. 02/13/19   Maxwell Caul, PA-C  fluticasone (FLONASE) 50 MCG/ACT nasal spray Place 1 spray into both nostrils daily for 7 days. 01/28/21 02/04/21  Tanda Rockers A, DO  ipratropium (ATROVENT) 0.06 % nasal spray Place 1 spray into both nostrils every 6 (six) hours as needed for rhinitis.  12/22/18   [provider]  modafinil (PROVIGIL) 100 MG tablet Take 1  tablet (100 mg total) by mouth daily. Patient not taking: Reported on 10/24/2020 09/19/20   Waymon Budge, MD  ondansetron (ZOFRAN) 4 MG tablet Take 1 tablet (4 mg total) by mouth every 8 (eight) hours as needed for nausea or vomiting. 01/28/21   Sloan Leiter, DO  predniSONE (STERAPRED UNI-PAK 21 TAB) 10 MG (21) TBPK tablet 10mg  Tabs, 6 day taper. Use as directed 07/23/22   Pollyann Savoy, MD  promethazine-dextromethorphan (PROMETHAZINE-DM) 6.25-15 MG/5ML syrup Take 5 mLs by mouth at bedtime as needed for cough. 07/24/20   Wallis Bamberg, PA-C  pseudoephedrine (SUDAFED) 30 MG tablet Take 1 tablet (30 mg total) by mouth every 8 (eight) hours as needed for congestion. 07/24/20   Wallis Bamberg, PA-C  rizatriptan (MAXALT) 10 MG tablet Take 1 tablet earliest onset of migraine.  May repeat in 2 hours if needed.  Maximum 2 tablets in 24 hours. 02/09/19   Drema Dallas, DO  sucralfate (CARAFATE) 1 g tablet Take 1 tablet (1 g total) by mouth 4 (four) times daily -  with meals and at bedtime for 5 days. 01/28/21 02/02/21  Sloan Leiter, DO  zolpidem (AMBIEN) 5 MG tablet 1 at bedtime for sleep Patient not taking: Reported on 10/24/2020 09/19/20   Waymon Budge, MD  nortriptyline (PAMELOR) 10 MG capsule Take 1 capsule at bedtime for a week, then increase to 2 capsules at bedtime. 02/09/19 11/23/19  Drema Dallas, DO    Family History History reviewed. No pertinent family history.  Social History Social History   Tobacco Use   Smoking status: Never    Passive exposure: Never   Smokeless tobacco: Never  Vaping Use   Vaping status: Never Used  Substance Use Topics   Alcohol use: Yes    Comment: Social   Drug use: No     Allergies   Bee venom, Kiwi extract, Mucinex clear & cool day-night, and Pineapple   Review of Systems Review of Systems  Gastrointestinal:  Positive for abdominal pain.  Per HPI   Physical Exam Triage Vital Signs ED Triage Vitals [09/08/22 1339]  Encounter Vitals Group     BP  119/74     Systolic BP Percentile      Diastolic BP Percentile      Pulse Rate 76     Resp 16     Temp 99.3 F (37.4 C)     Temp Source Oral     SpO2 98 %     Weight      Height      Head Circumference      Peak Flow      Pain Score      Pain Loc      Pain Education  Exclude from Growth Chart    No data found.  Updated Vital Signs BP 119/74 (BP Location: Left Arm)   Pulse 76   Temp 99.3 F (37.4 C) (Oral)   Resp 16   LMP 09/05/2022 (Exact Date)   SpO2 98%   Visual Acuity Right Eye Distance:   Left Eye Distance:   Bilateral Distance:    Right Eye Near:   Left Eye Near:    Bilateral Near:     Physical Exam Vitals and nursing note reviewed. Exam conducted with a chaperone present [facility], CMA).  Constitutional:      Appearance: She is not ill-appearing or toxic-appearing.  HENT:     Head: Normocephalic and atraumatic.     Right Ear: Hearing and external ear normal.     Left Ear: Hearing and external ear normal.     Nose: Nose normal.     Mouth/Throat:     Lips: Pink.  Eyes:     General: Lids are normal. Vision grossly intact. Gaze aligned appropriately.     Extraocular Movements: Extraocular movements intact.     Conjunctiva/sclera: Conjunctivae normal.  Pulmonary:     Effort: Pulmonary effort is normal.  Abdominal:     General: Bowel sounds are normal.     Palpations: Abdomen is soft.     Tenderness: There is no abdominal tenderness. There is no right CVA tenderness, left CVA tenderness or guarding.  Genitourinary:    Exam position: Knee-chest position.     Pubic Area: No rash.      Labia:        Right: Tenderness and lesion present. No rash or injury.        Left: No rash, tenderness, lesion or injury.     Musculoskeletal:     Cervical back: Neck supple.  Skin:    General: Skin is warm and dry.     Capillary Refill: Capillary refill takes less than 2 seconds.     Findings: No rash.  Neurological:     General: No focal deficit present.      Mental Status: She is alert and oriented to person, place, and time. Mental status is at baseline.     Cranial Nerves: No dysarthria or facial asymmetry.  Psychiatric:        Mood and Affect: Mood normal.        Speech: Speech normal.        Behavior: Behavior normal.        Thought Content: Thought content normal.        Judgment: Judgment normal.      UC Treatments / Results  Labs (all labs ordered are listed, but only abnormal results are displayed) Labs Reviewed  POCT URINALYSIS DIP (MANUAL ENTRY) - Abnormal; Notable for the following components:      Result Value   Color, UA straw (*)    Leukocytes, UA Trace (*)    All other components within normal limits  HSV CULTURE AND TYPING  HIV ANTIBODY (ROUTINE TESTING W REFLEX)  RPR  CERVICOVAGINAL ANCILLARY ONLY    EKG   Radiology No results found.  Procedures Procedures (including critical care time)  Medications Ordered in UC Medications - No data to display  Initial Impression / Assessment and Plan / UC Course  I have reviewed the triage vital signs and the nursing notes.  Pertinent labs & imaging results that were available during my care of the patient were reviewed by me and considered in my medical decision  making (see chart for details).   1. 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  HSV culture is pending and will start Valtrex 3 times a day for the next 7 days today.  Urinalysis is unremarkable for signs of urinary tract infection. Low suspicion for PID.  Abdominal exam is without peritoneal signs. Vital signs stable.  STI labs pending.  Patient would like HIV and syphilis testing today.  Will notify patient of positive results and treat accordingly when labs come back.  Patient to avoid sexual intercourse until screening testing comes back.  Education provided regarding safe sexual practices and patient encouraged to use protection to  prevent spread of STIs.   Counseled patient on potential for adverse effects with medications prescribed/recommended today, strict ER and return-to-clinic precautions discussed, patient verbalized understanding.   Final Clinical Impressions(s) / UC Diagnoses   Final diagnoses:  Abdominal pain, left lower quadrant  Vaginal candidiasis  Vaginal pain     Discharge Instructions      I am concerned that your vaginal pain may be due to herpes simplex virus (HSV) which is a sexually transmitted viral infection.  I have sent HSV swab off for testing.  I will call you if this is positive. I would like to go ahead and start treatment with Valtrex 3 times a day for the next 7 days.  You may crush the Valtrex tablets and take these with yogurt or applesauce.   Apply clotrimazole cream to the outer vaginal area twice daily for the next 5 to 7 days to treat other rash to the lower vaginal area/buttocks as a suspect that this is likely yeast vaginitis.  The rest of your STD testing will come back in the next 2 to 3 days and we will call you if anything else is abnormal.  If you develop any new or worsening symptoms or if your symptoms do not start to improve, pleases return here or follow-up with your primary care provider. If your symptoms are severe, please go to the emergency room.     ED Prescriptions     Medication Sig Dispense Auth. Provider   valACYclovir (VALTREX) 1000 MG tablet Take 1 tablet (1,000 mg total) by mouth 3 (three) times daily for 7 days. 21 tablet Reita May M, FNP   clotrimazole (LOTRIMIN) 1 % cream Apply to affected area 2 times daily 15 g Autumn Beers, FNP      PDMP not reviewed this encounter.   Autumn Aguilar, [place] 09/08/22 1459

## 2022-09-09 LAB — CERVICOVAGINAL ANCILLARY ONLY
Bacterial Vaginitis (gardnerella): NEGATIVE
Candida Glabrata: NEGATIVE
Candida Vaginitis: POSITIVE — AB
Chlamydia: NEGATIVE
Comment: NEGATIVE
Comment: NEGATIVE
Comment: NEGATIVE
Comment: NEGATIVE
Comment: NEGATIVE
Comment: NORMAL
Neisseria Gonorrhea: NEGATIVE
Trichomonas: NEGATIVE

## 2022-09-09 LAB — RPR: RPR Ser Ql: NONREACTIVE

## 2022-09-10 ENCOUNTER — Telehealth (HOSPITAL_COMMUNITY): Payer: Self-pay | Admitting: Emergency Medicine

## 2022-09-10 MED ORDER — FLUCONAZOLE 150 MG PO TABS: 150.0000 mg | ORAL_TABLET | Freq: Once | ORAL | 0 refills | Status: AC

## 2022-09-11 LAB — HSV CULTURE AND TYPING

## 2022-09-19 IMAGING — DX DG CHEST 2V
2 series · 2 of 2 positions shown · non-contrast
Comparison: None.

CLINICAL DATA: Cough, congestion.  Chest pain.

EXAM:
CHEST - 2 VIEW

[chest pa]
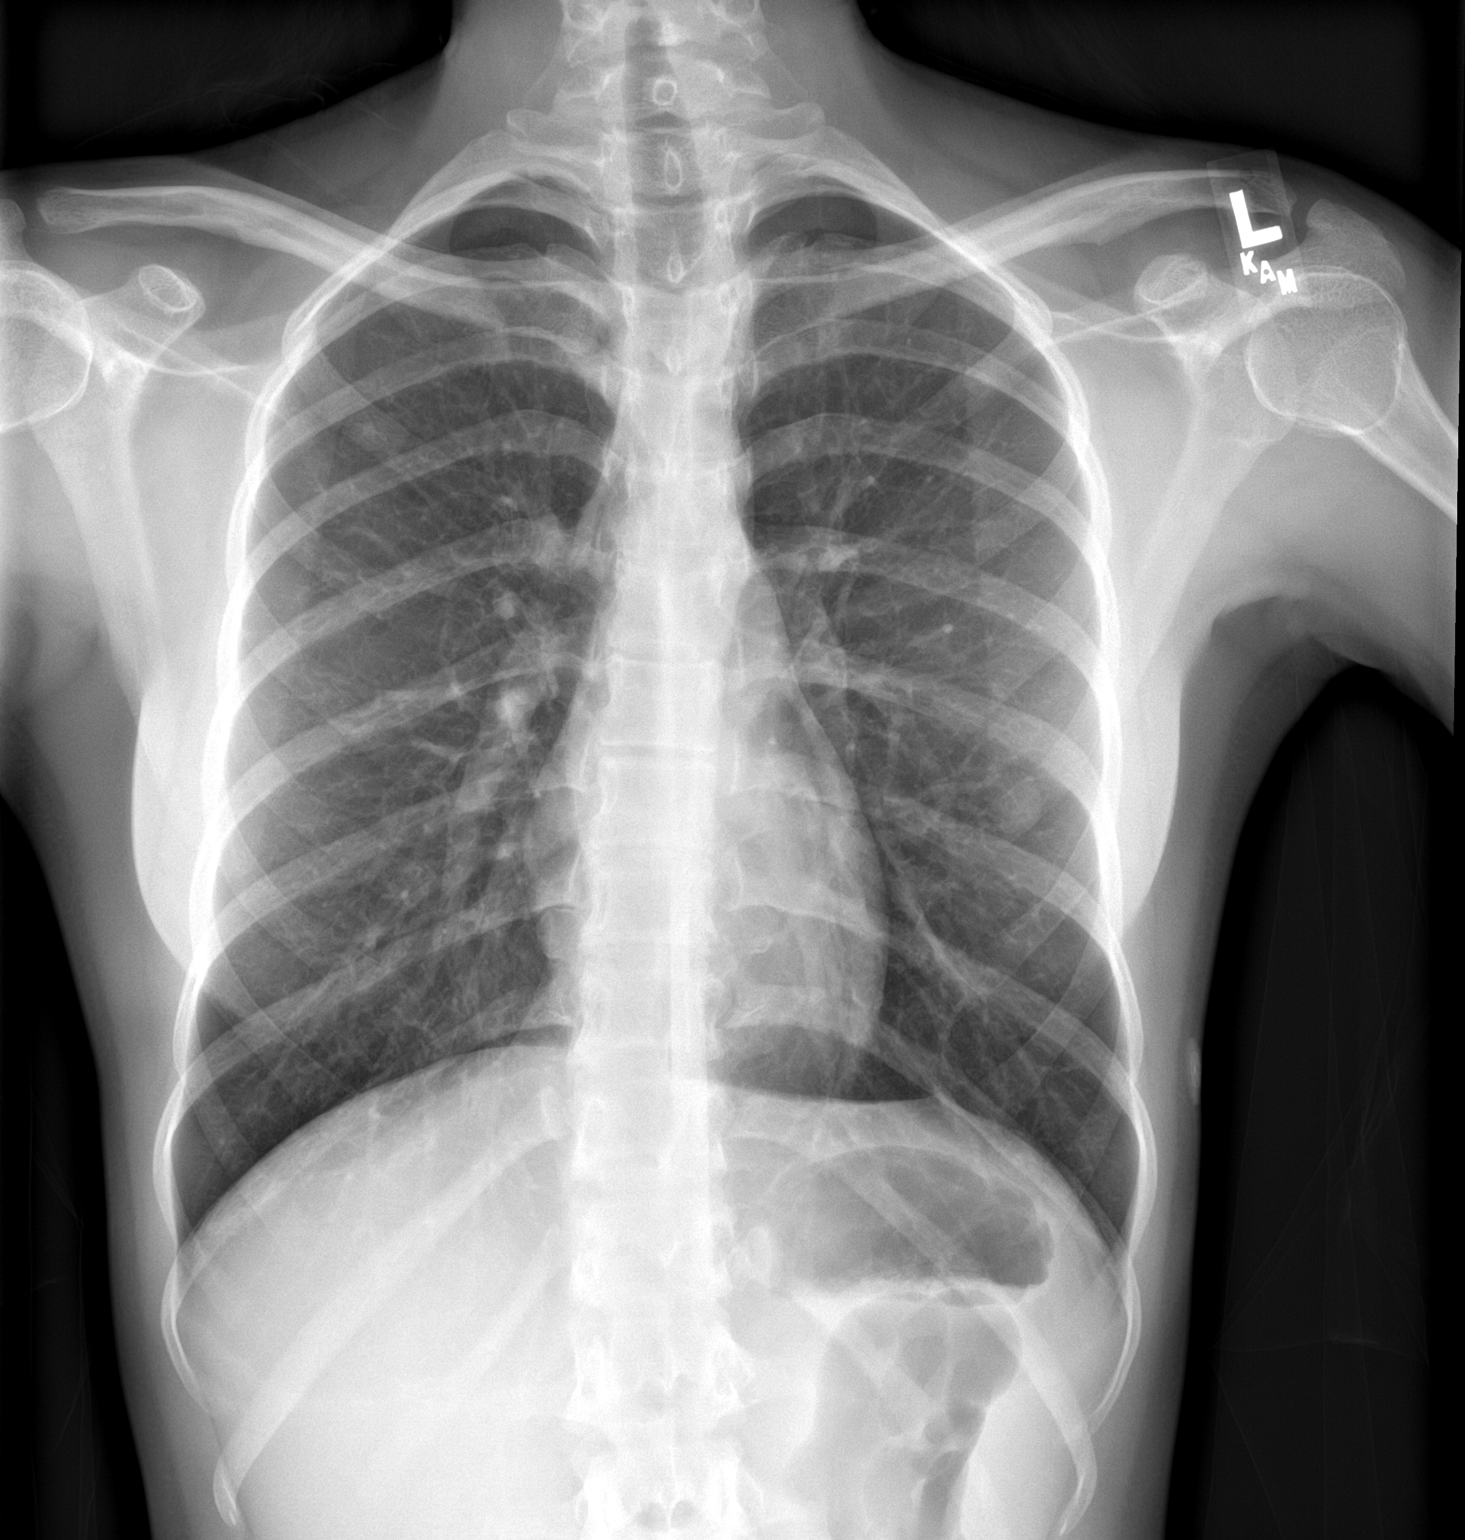

[chest lat]
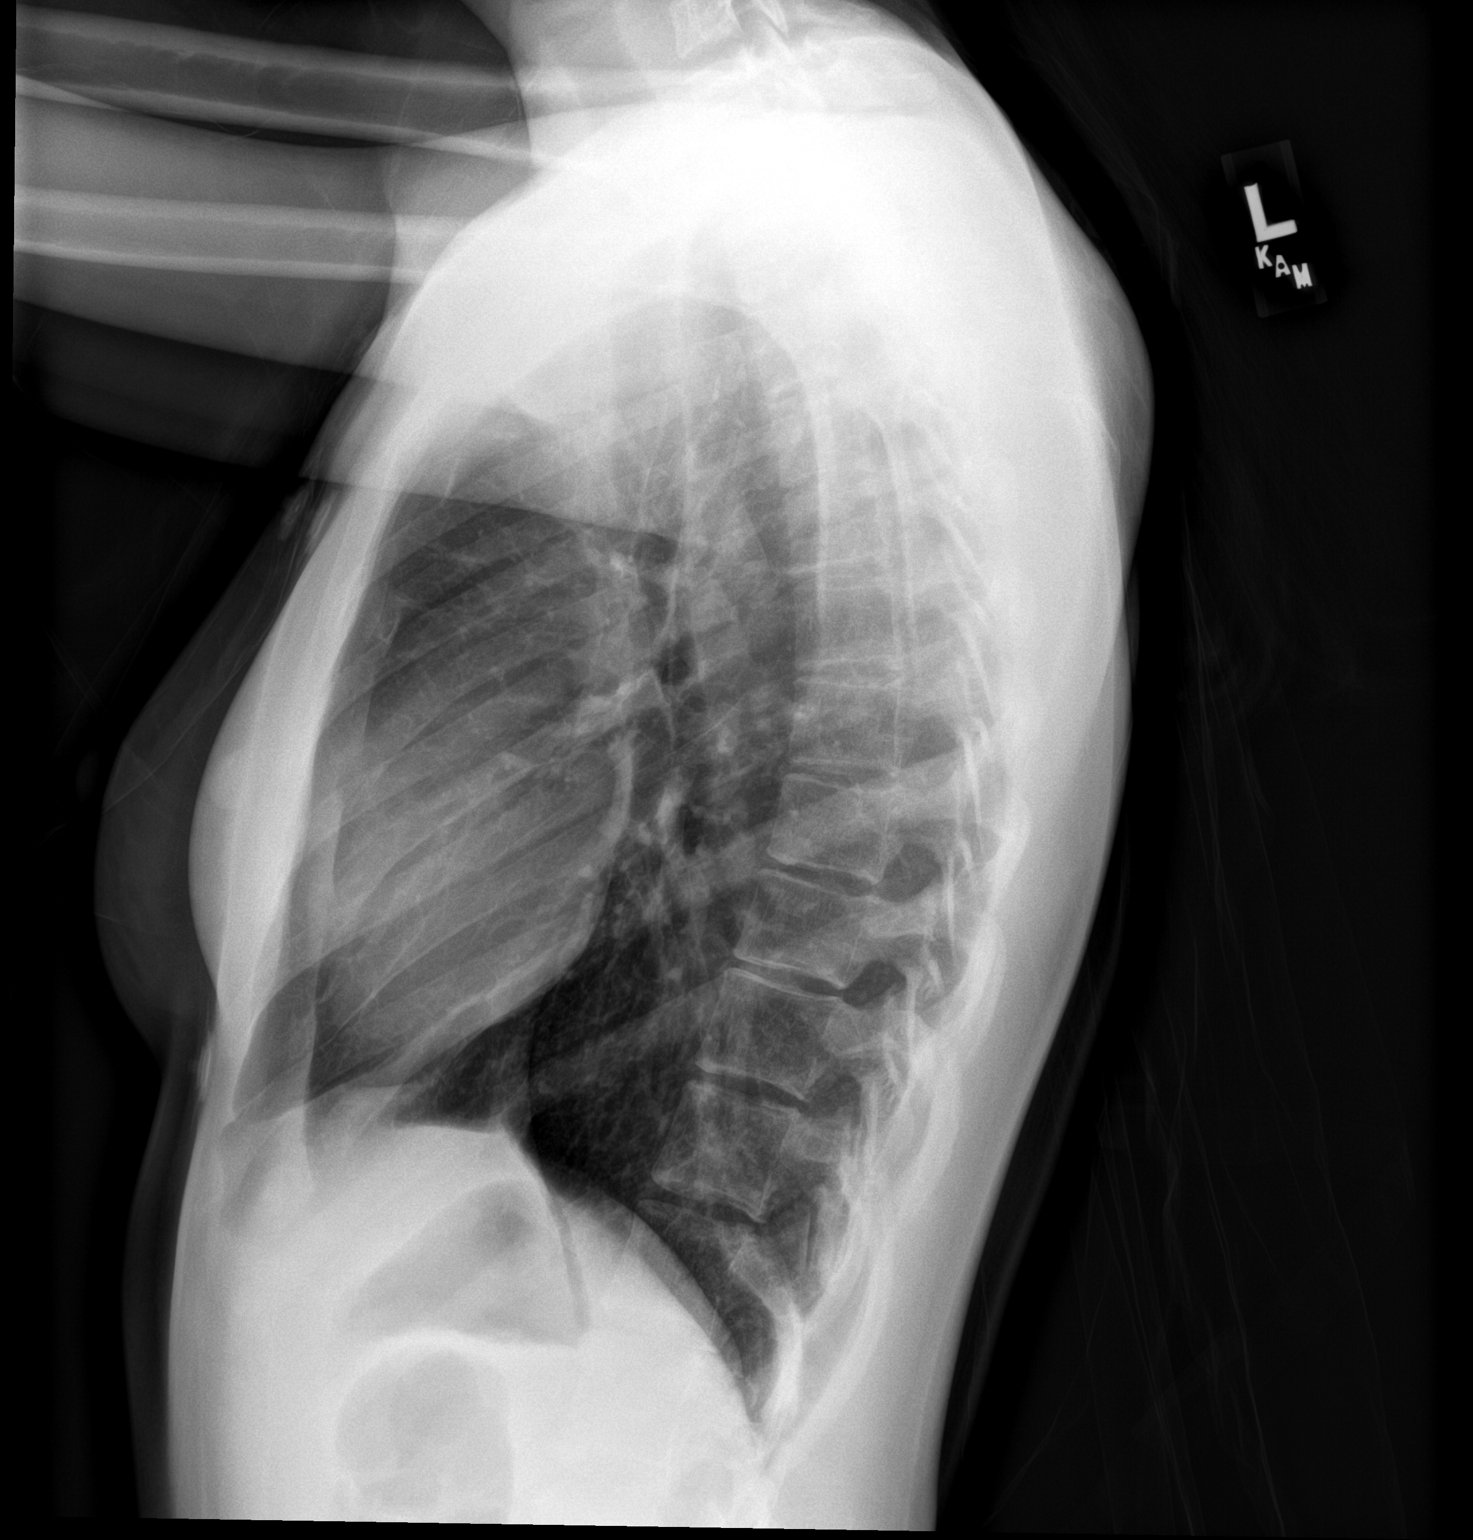

[2 of 2 positions shown; findings below may reference images not displayed]

FINDINGS: The heart size and mediastinal contours are within normal limits.
Both lungs are clear. The visualized skeletal structures are
unremarkable.
IMPRESSION: No active cardiopulmonary disease.

## 2022-12-01 IMAGING — US US EXTREM LOW VENOUS*R*
1 series · 14 of 24 positions shown · non-contrast
Comparison: None.

CLINICAL DATA: Right leg and foot swelling for 5 days.

EXAM:
RIGHT LOWER EXTREMITY VENOUS DOPPLER ULTRASOUND
TECHNIQUE: Gray-scale sonography with compression, as well as color and duplex
ultrasound, were performed to evaluate the deep venous system(s)
from the level of the common femoral vein through the popliteal and
proximal calf veins.

[Series 1: us venous img lower uni right (dvt) · portal-venous · 14 of 35 slices shown]
[im 1/35]
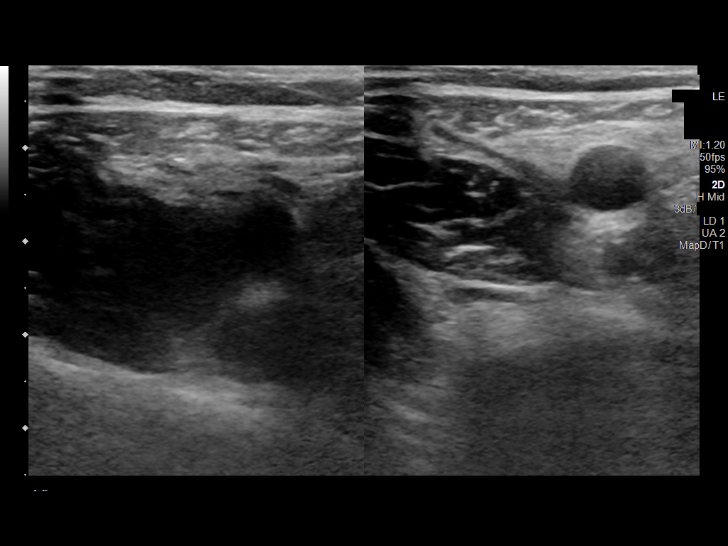
[im 3/35]
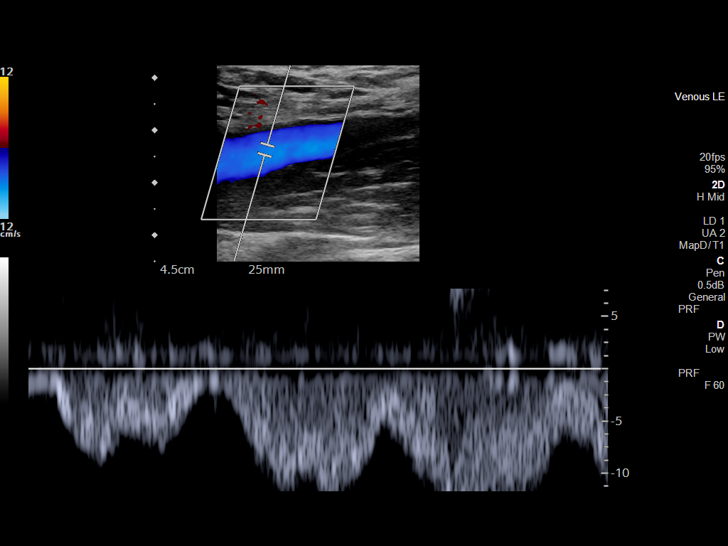
[im 6/35]
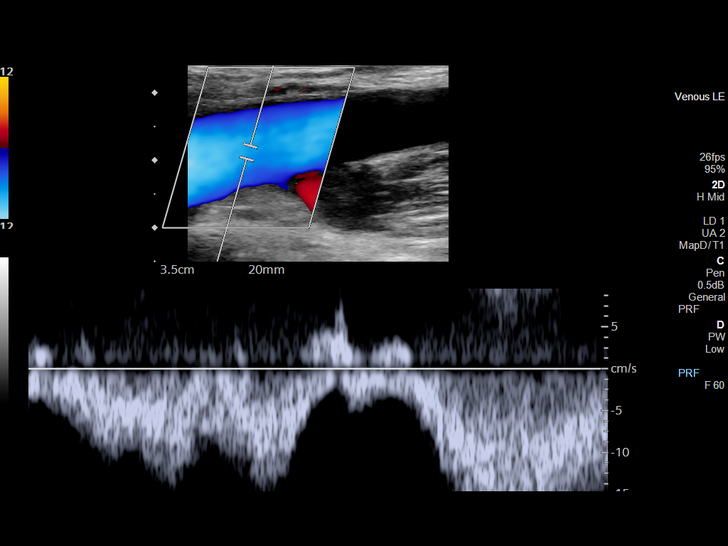
[im 9/35]
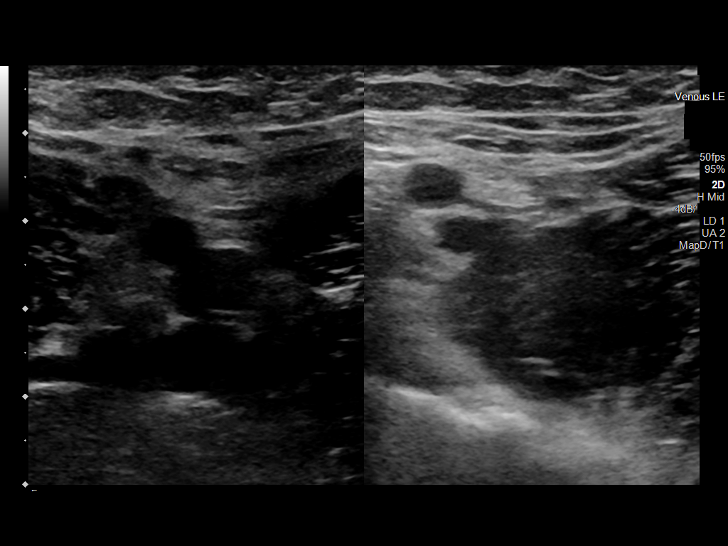
[im 11/35]
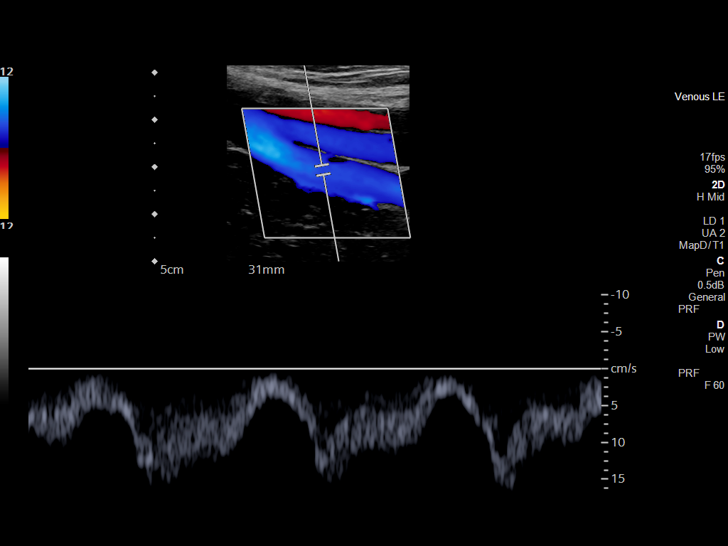
[im 14/35]
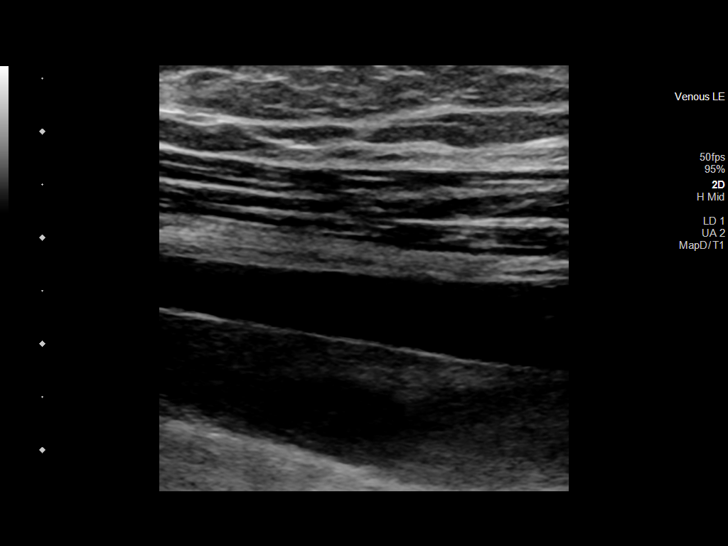
[im 17/35]
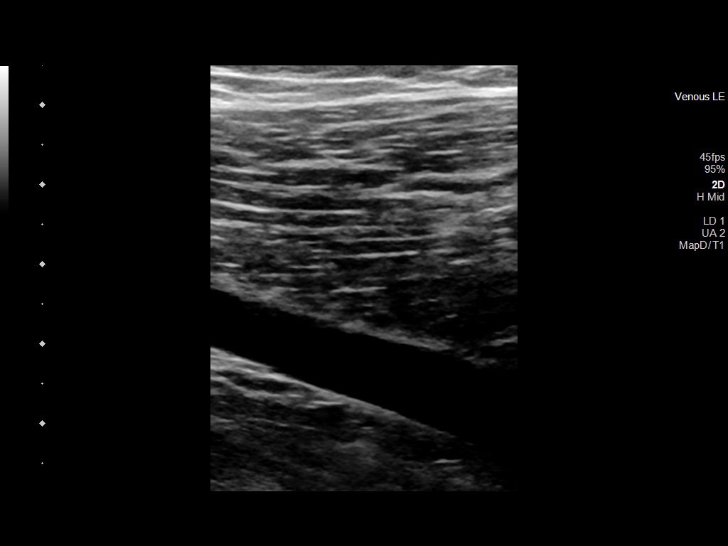
[im 18/35]
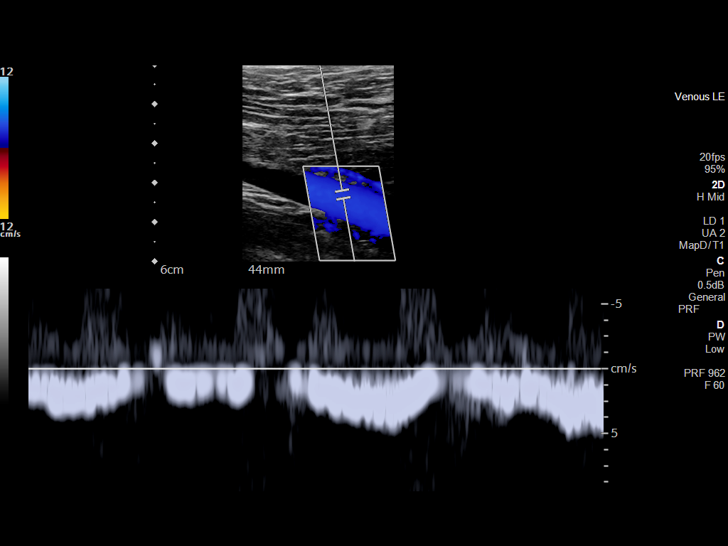
[im 21/35]
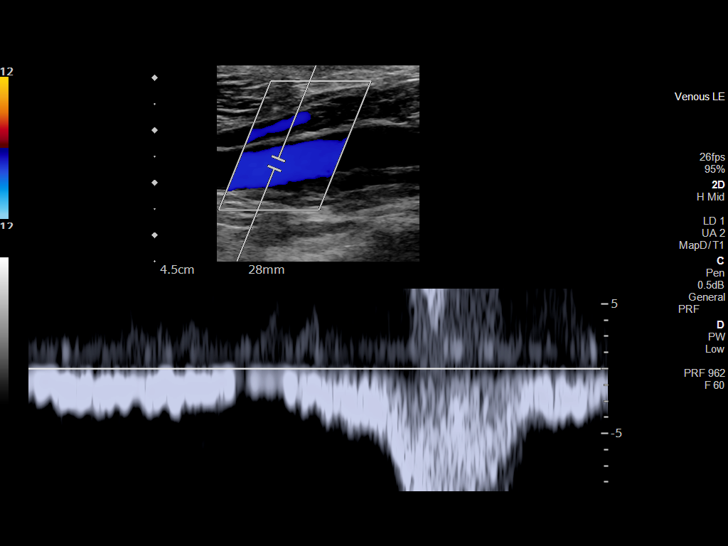
[im 24/35]
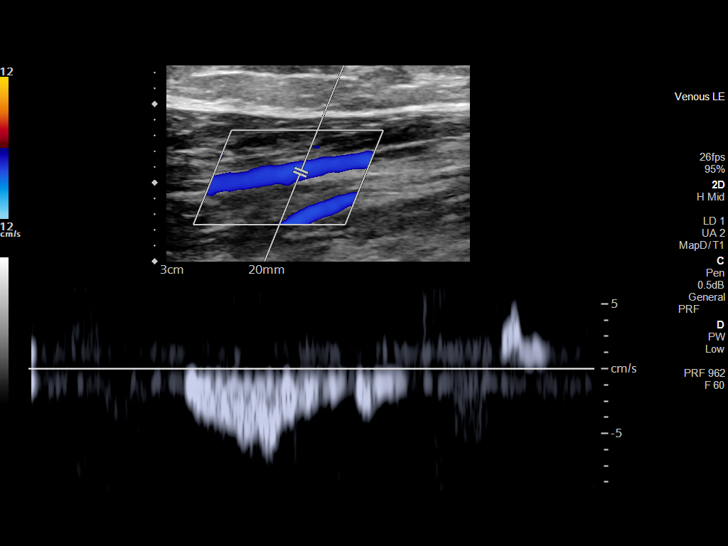
[im 27/35]
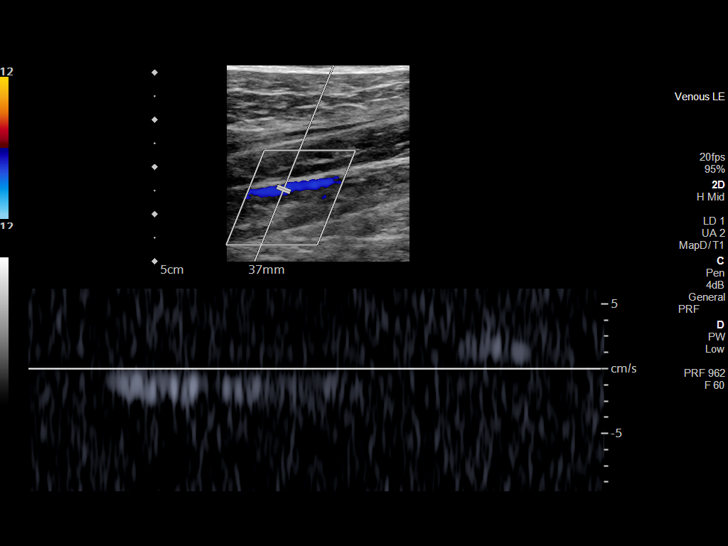
[im 29/35]
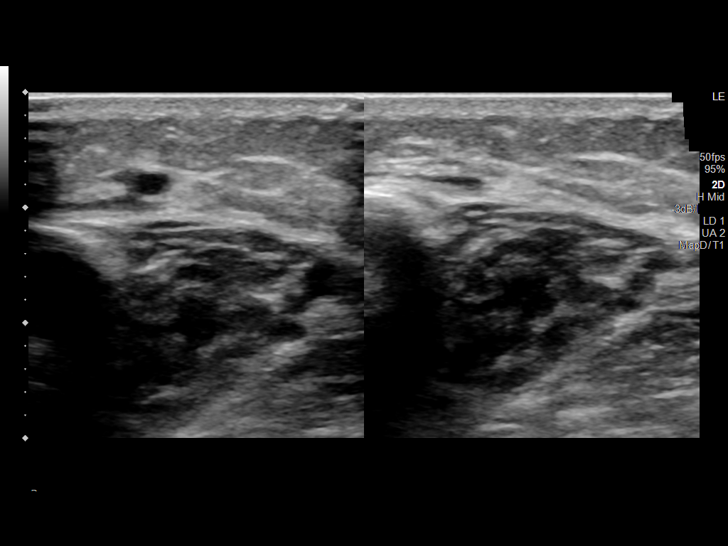
[im 32/35]
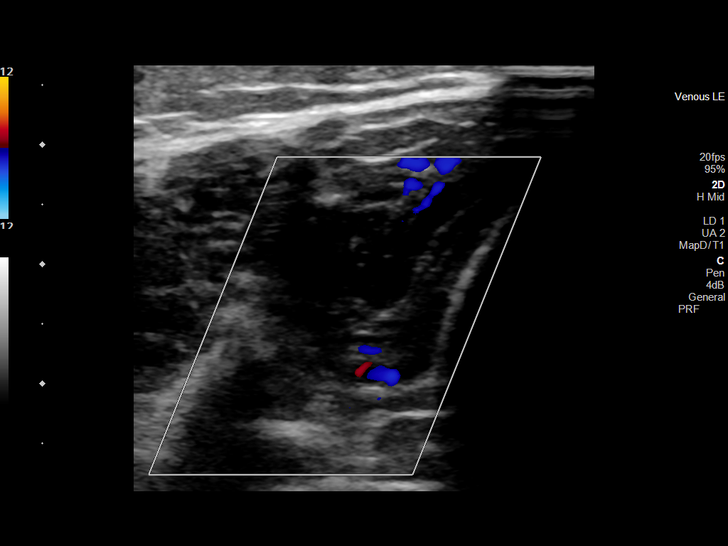
[im 35/35]
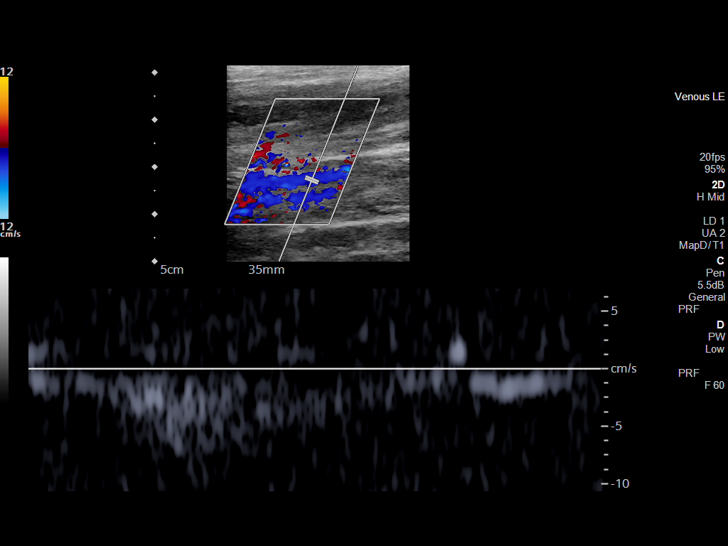

[14 of 24 positions shown; findings below may reference images not displayed]

FINDINGS: VENOUS

Normal compressibility of the common femoral, superficial femoral,
and popliteal veins, as well as the visualized calf veins.
Visualized portions of profunda femoral vein and great saphenous
vein unremarkable. No filling defects to suggest DVT on grayscale or
color Doppler imaging. Doppler waveforms show normal direction of
venous flow, normal respiratory plasticity and response to
augmentation.

Limited views of the contralateral common femoral vein are
unremarkable.

OTHER

None.

Limitations: none
IMPRESSION: Negative.

## 2023-01-30 ENCOUNTER — Other Ambulatory Visit: Payer: Self-pay

## 2023-01-30 ENCOUNTER — Emergency Department (HOSPITAL_BASED_OUTPATIENT_CLINIC_OR_DEPARTMENT_OTHER)
Admission: EM | Admit: 2023-01-30 | Discharge: 2023-01-31 | Disposition: A | Discharge status: Home / Self Care | Payer: Medicaid Other | Attending: Emergency Medicine | Admitting: Emergency Medicine

## 2023-01-30 ENCOUNTER — Encounter (HOSPITAL_BASED_OUTPATIENT_CLINIC_OR_DEPARTMENT_OTHER): Payer: Self-pay

## 2023-01-30 DIAGNOSIS — T18128A Food in esophagus causing other injury, initial encounter: Secondary | ICD-10-CM | POA: Insufficient documentation

## 2023-01-30 DIAGNOSIS — W44F3XA Food entering into or through a natural orifice, initial encounter: Secondary | ICD-10-CM | POA: Diagnosis not present

## 2023-01-30 NOTE — ED Triage Notes (Signed)
Pt reports being choking 35-40 minutes ago on some chips, evaluated by EMS and was recommended that she let them take her to the hospital. Pt declined and brought herself here. Pt reports being able to spit some of it out and swallow some of it. Pt states she coughed up some blood afterward. Not actively coughing or bleeding in triage. Pt states area feels like she still may have some food left there. Mild difficulty swallowing water reported. Pt talking and breathing normally. No difficulty handling secretions.

## 2023-01-31 NOTE — ED Provider Notes (Signed)
[place] [facility] AT [facility] Provider Note   CSN: [id number] Arrival date & time: 01/30/23  2315     History  Chief Complaint  Patient presents with   Cough    Autumn Aguilar is a 22 y.o. female.  Patient is a 22 year old female with history of esophageal stricture diagnosed as a child, but lost to follow-up.  Patient presents today with complaints of possible esophageal obstruction.  She tells me she was eating a Chipotle bowl meal using tortilla chips.  She believes she ate too fast and got several bites of food stuck in her esophagus.  She felt as though she was choking for several minutes.  Eventually, her symptoms resolved and the food seemed to move into her stomach.  She does report coughing once and noticing a small amount of blood.  She still feels as though there is something in her throat, but is able to drink without difficulty.  No fevers or chills.  No chest pain.  The history is provided by the patient.       Home Medications Prior to Admission medications   Medication Sig Start Date End Date Taking? Authorizing Provider  acetaminophen (TYLENOL CHILDRENS) 160 MG/5ML suspension Take 10 mLs (320 mg total) by mouth every 6 (six) hours as needed. 01/28/21   Tanda Rockers A, DO  albuterol (PROVENTIL) (2.5 MG/3ML) 0.083% nebulizer solution Take 3 mLs (2.5 mg total) by nebulization every 4 (four) hours as needed for wheezing or shortness of breath. 02/13/19   Maxwell Caul, PA-C  albuterol (VENTOLIN HFA) 108 (90 Base) MCG/ACT inhaler Inhale 2 puffs into the lungs every 4 (four) hours as needed for shortness of breath. 02/13/19   Graciella Freer A, PA-C  B Complex Vitamins (VITAMIN B COMPLEX PO) Take 1 tablet by mouth daily.    [provider]  benzonatate (TESSALON) 100 MG capsule Take 1-2 capsules (100-200 mg total) by mouth 3 (three) times daily as needed for cough. 11/23/19   Wallis Bamberg, PA-C  cetirizine (ZYRTEC ALLERGY) 10 MG  tablet Take 1 tablet (10 mg total) by mouth daily. 07/24/20   Wallis Bamberg, PA-C  clotrimazole (LOTRIMIN) 1 % cream Apply to affected area 2 times daily 09/08/22   Carlisle Beers, FNP  dextromethorphan (DELSYM) 30 MG/5ML liquid Take 2.5 mLs (15 mg total) by mouth as needed for cough. 01/28/21   Sloan Leiter, DO  fluticasone (FLONASE) 50 MCG/ACT nasal spray Place 1 spray into both nostrils daily. 02/13/19   Maxwell Caul, PA-C  fluticasone (FLONASE) 50 MCG/ACT nasal spray Place 1 spray into both nostrils daily for 7 days. 01/28/21 02/04/21  Tanda Rockers A, DO  ipratropium (ATROVENT) 0.06 % nasal spray Place 1 spray into both nostrils every 6 (six) hours as needed for rhinitis.  12/22/18   [provider]  modafinil (PROVIGIL) 100 MG tablet Take 1 tablet (100 mg total) by mouth daily. Patient not taking: Reported on 10/24/2020 09/19/20   Waymon Budge, MD  ondansetron (ZOFRAN) 4 MG tablet Take 1 tablet (4 mg total) by mouth every 8 (eight) hours as needed for nausea or vomiting. 01/28/21   Sloan Leiter, DO  predniSONE (STERAPRED UNI-PAK 21 TAB) 10 MG (21) TBPK tablet 10mg  Tabs, 6 day taper. Use as directed 07/23/22   Pollyann Savoy, MD  promethazine-dextromethorphan (PROMETHAZINE-DM) 6.25-15 MG/5ML syrup Take 5 mLs by mouth at bedtime as needed for cough. 07/24/20   Wallis Bamberg, PA-C  pseudoephedrine (SUDAFED) 30 MG  tablet Take 1 tablet (30 mg total) by mouth every 8 (eight) hours as needed for congestion. 07/24/20   Wallis Bamberg, PA-C  rizatriptan (MAXALT) 10 MG tablet Take 1 tablet earliest onset of migraine.  May repeat in 2 hours if needed.  Maximum 2 tablets in 24 hours. 02/09/19   Drema Dallas, DO  sucralfate (CARAFATE) 1 g tablet Take 1 tablet (1 g total) by mouth 4 (four) times daily -  with meals and at bedtime for 5 days. 01/28/21 02/02/21  Sloan Leiter, DO  zolpidem (AMBIEN) 5 MG tablet 1 at bedtime for sleep Patient not taking: Reported on 10/24/2020 09/19/20   Waymon Budge, MD  nortriptyline (PAMELOR) 10 MG capsule Take 1 capsule at bedtime for a week, then increase to 2 capsules at bedtime. 02/09/19 11/23/19  Drema Dallas, DO      Allergies    Bee venom, Kiwi extract, Mucinex clear & cool day-night, and Pineapple    Review of Systems   Review of Systems  All other systems reviewed and are negative.   Physical Exam Updated Vital Signs BP 105/63   Pulse 75   Temp 98.7 F (37.1 C)   Resp 20   Ht 5\' 9"  (1.753 m)   Wt 63.5 kg   SpO2 99%   BMI 20.67 kg/m  Physical Exam Vitals and nursing note reviewed.  Constitutional:      General: She is not in acute distress.    Appearance: She is well-developed. She is not diaphoretic.  HENT:     Head: Normocephalic and atraumatic.  Neck:     Comments: Patient's neck is grossly normal in appearance.  There is no crepitus or significant swelling.  She does have mild tenderness overlying the proximal trachea. Cardiovascular:     Rate and Rhythm: Normal rate and regular rhythm.     Heart sounds: No murmur heard.    No friction rub. No gallop.  Pulmonary:     Effort: Pulmonary effort is normal. No respiratory distress.     Breath sounds: Normal breath sounds. No wheezing.  Abdominal:     General: Bowel sounds are normal. There is no distension.     Palpations: Abdomen is soft.     Tenderness: There is no abdominal tenderness.  Musculoskeletal:        General: Normal range of motion.     Cervical back: Normal range of motion and neck supple.  Skin:    General: Skin is warm and dry.  Neurological:     General: No focal deficit present.     Mental Status: She is alert and oriented to person, place, and time.     ED Results / Procedures / Treatments   Labs (all labs ordered are listed, but only abnormal results are displayed) Labs Reviewed - No data to display  EKG None  Radiology No results found.  Procedures Procedures    Medications Ordered in ED Medications - No data to display  ED  Course/ Medical Decision Making/ A&P  Patient presenting with complaints of esophageal food impaction.  This started earlier this evening after eating a Chipotle burrito bowl using a tortilla chip.  Several bites of food became lodged in her esophagus causing her to "choke".  After several minutes, she was able to get the food dislodged, but has been having irritation in her throat since.  Patient arrives with stable vital signs and is afebrile.  Physical examination is unremarkable.  There is no  palpable abnormality of her anterior neck, no swelling, and no crepitus.  Shortly after arrival, patient was given a cup of ice water which she was able to drink and tolerate without vomiting or regurgitation.  She does feel the cold water present in her stomach, but does have some discomfort when she swallows.  I suspect that a particle of food, possibly a tortilla chip scratched her throat resulting in this irritation.  I highly doubt esophageal perforation/Boerhaave's syndrome.  Patient is nontoxic in appearance and seems very comfortable.  At this point, I will discharge her and have her follow-up with gastroenterology.  She does report a history of an abnormal swallow study roughly 10 years ago, but did not follow-up for this.  Final Clinical Impression(s) / ED Diagnoses Final diagnoses:  None    Rx / DC Orders ED Discharge Orders     None         Geoffery Lyons, MD 01/31/23 [phone]

## 2023-01-31 NOTE — Discharge Instructions (Signed)
Clear liquids for the next 12 hours, then slowly advance diet as tolerated being sure to take small bites and chew your food thoroughly.  Follow-up with gastroenterology.  The contact information for Dr. Lorenso Quarry at [facility] has been provided in this discharge summary for you to call and make these arrangements.  Return to the ER if you develop worsening pain, worsening swallowing, or for other new and concerning symptoms.

## 2023-02-10 ENCOUNTER — Emergency Department (HOSPITAL_BASED_OUTPATIENT_CLINIC_OR_DEPARTMENT_OTHER)
Admission: EM | Admit: 2023-02-10 | Discharge: 2023-02-11 | Disposition: A | Discharge status: Home / Self Care | Payer: Medicaid Other | Attending: Emergency Medicine | Admitting: Emergency Medicine

## 2023-02-10 ENCOUNTER — Other Ambulatory Visit: Payer: Self-pay

## 2023-02-10 ENCOUNTER — Encounter (HOSPITAL_BASED_OUTPATIENT_CLINIC_OR_DEPARTMENT_OTHER): Payer: Self-pay | Admitting: Emergency Medicine

## 2023-02-10 DIAGNOSIS — J020 Streptococcal pharyngitis: Secondary | ICD-10-CM | POA: Diagnosis not present

## 2023-02-10 DIAGNOSIS — R509 Fever, unspecified: Secondary | ICD-10-CM | POA: Diagnosis present

## 2023-02-10 DIAGNOSIS — Z20822 Contact with and (suspected) exposure to covid-19: Secondary | ICD-10-CM | POA: Diagnosis not present

## 2023-02-10 LAB — RESP PANEL BY RT-PCR (RSV, FLU A&B, COVID)  RVPGX2
Influenza A by PCR: NEGATIVE
Influenza B by PCR: NEGATIVE
Resp Syncytial Virus by PCR: NEGATIVE
SARS Coronavirus 2 by RT PCR: NEGATIVE

## 2023-02-10 LAB — GROUP A STREP BY PCR: Group A Strep by PCR: DETECTED — AB

## 2023-02-10 MED ORDER — AMOXICILLIN 400 MG/5ML PO SUSR
1000.0000 mg | Freq: Once | ORAL | Status: AC
  Administered 2023-02-10: 1000 mg via ORAL
  Filled 2023-02-10: qty 15

## 2023-02-10 MED ORDER — DEXAMETHASONE 10 MG/ML FOR PEDIATRIC ORAL USE
10.0000 mg | Freq: Once | INTRAMUSCULAR | Status: AC
  Administered 2023-02-10: 10 mg via ORAL
  Filled 2023-02-10: qty 1

## 2023-02-10 MED ORDER — AMOXICILLIN 400 MG/5ML PO SUSR: 1000.0000 mg | Freq: Two times a day (BID) | ORAL | Status: DC

## 2023-02-10 NOTE — ED Provider Notes (Signed)
[place] EMERGENCY DEPARTMENT AT [facility] Provider Note   CSN: [id number] Arrival date & time: 02/10/23  1926     History  Chief Complaint  Patient presents with   Sore Throat    Autumn Aguilar is a 22 y.o. female.  The history is provided by the patient.  Sore Throat  She complains of sore throat for the last 4 days, getting worse.  She has had temperature as high as 102.4 with associated chills and sweats.  She has also had a cough productive of yellow sputum.  She denies nausea or vomiting.  She denies any sick contacts.   Home Medications Prior to Admission medications   Medication Sig Start Date End Date Taking? Authorizing Provider  acetaminophen (TYLENOL CHILDRENS) 160 MG/5ML suspension Take 10 mLs (320 mg total) by mouth every 6 (six) hours as needed. 01/28/21   Tanda Rockers A, DO  albuterol (PROVENTIL) (2.5 MG/3ML) 0.083% nebulizer solution Take 3 mLs (2.5 mg total) by nebulization every 4 (four) hours as needed for wheezing or shortness of breath. 02/13/19   Maxwell Caul, PA-C  albuterol (VENTOLIN HFA) 108 (90 Base) MCG/ACT inhaler Inhale 2 puffs into the lungs every 4 (four) hours as needed for shortness of breath. 02/13/19   Graciella Freer A, PA-C  B Complex Vitamins (VITAMIN B COMPLEX PO) Take 1 tablet by mouth daily.    [provider]  benzonatate (TESSALON) 100 MG capsule Take 1-2 capsules (100-200 mg total) by mouth 3 (three) times daily as needed for cough. 11/23/19   Wallis Bamberg, PA-C  cetirizine (ZYRTEC ALLERGY) 10 MG tablet Take 1 tablet (10 mg total) by mouth daily. 07/24/20   Wallis Bamberg, PA-C  clotrimazole (LOTRIMIN) 1 % cream Apply to affected area 2 times daily 09/08/22   Carlisle Beers, FNP  dextromethorphan (DELSYM) 30 MG/5ML liquid Take 2.5 mLs (15 mg total) by mouth as needed for cough. 01/28/21   Sloan Leiter, DO  fluticasone (FLONASE) 50 MCG/ACT nasal spray Place 1 spray into both nostrils daily. 02/13/19    Maxwell Caul, PA-C  fluticasone (FLONASE) 50 MCG/ACT nasal spray Place 1 spray into both nostrils daily for 7 days. 01/28/21 02/04/21  Tanda Rockers A, DO  ipratropium (ATROVENT) 0.06 % nasal spray Place 1 spray into both nostrils every 6 (six) hours as needed for rhinitis.  12/22/18   [provider]  modafinil (PROVIGIL) 100 MG tablet Take 1 tablet (100 mg total) by mouth daily. Patient not taking: Reported on 10/24/2020 09/19/20   Waymon Budge, MD  ondansetron (ZOFRAN) 4 MG tablet Take 1 tablet (4 mg total) by mouth every 8 (eight) hours as needed for nausea or vomiting. 01/28/21   Sloan Leiter, DO  predniSONE (STERAPRED UNI-PAK 21 TAB) 10 MG (21) TBPK tablet 10mg  Tabs, 6 day taper. Use as directed 07/23/22   Pollyann Savoy, MD  promethazine-dextromethorphan (PROMETHAZINE-DM) 6.25-15 MG/5ML syrup Take 5 mLs by mouth at bedtime as needed for cough. 07/24/20   Wallis Bamberg, PA-C  pseudoephedrine (SUDAFED) 30 MG tablet Take 1 tablet (30 mg total) by mouth every 8 (eight) hours as needed for congestion. 07/24/20   Wallis Bamberg, PA-C  rizatriptan (MAXALT) 10 MG tablet Take 1 tablet earliest onset of migraine.  May repeat in 2 hours if needed.  Maximum 2 tablets in 24 hours. 02/09/19   Drema Dallas, DO  sucralfate (CARAFATE) 1 g tablet Take 1 tablet (1 g total) by mouth 4 (four) times  daily -  with meals and at bedtime for 5 days. 01/28/21 02/02/21  Sloan Leiter, DO  zolpidem (AMBIEN) 5 MG tablet 1 at bedtime for sleep Patient not taking: Reported on 10/24/2020 09/19/20   Waymon Budge, MD  nortriptyline (PAMELOR) 10 MG capsule Take 1 capsule at bedtime for a week, then increase to 2 capsules at bedtime. 02/09/19 11/23/19  Drema Dallas, DO      Allergies    Bee venom, Kiwi extract, Mucinex clear & cool day-night, and Pineapple    Review of Systems   Review of Systems  All other systems reviewed and are negative.   Physical Exam Updated Vital Signs BP 116/66 (BP Location: Right Arm)    Pulse 83   Temp 98.1 F (36.7 C)   Resp 20   LMP 01/16/2023 (Approximate)   SpO2 100%  Physical Exam Vitals and nursing note reviewed.   22 year old female, resting comfortably and in no acute distress. Vital signs are normal. Oxygen saturation is 100%, which is normal. Head is normocephalic and atraumatic. PERRLA, EOMI. Oropharynx shows moderate bilateral tonsillar erythema and hypertrophy without exudate.  There is no pooling of secretions and normal phonation. Neck is nontender and supple with moderate bilateral anterior and posterior cervical adenopathy . Lungs are clear without rales, wheezes, or rhonchi. Chest is nontender. Heart has regular rate and rhythm without murmur. Abdomen is soft, flat, nontender. Skin is warm and dry without rash. Neurologic: Mental status is normal, moves all extremities equally.  ED Results / Procedures / Treatments   Labs (all labs ordered are listed, but only abnormal results are displayed) Labs Reviewed  GROUP A STREP BY PCR - Abnormal; Notable for the following components:      Result Value   Group A Strep by PCR DETECTED (*)    All other components within normal limits  RESP PANEL BY RT-PCR (RSV, FLU A&B, COVID)  [id number]   Procedures Procedures    Medications Ordered in ED Medications  dexamethasone (DECADRON) 10 MG/ML injection for Pediatric ORAL use 10 mg (has no administration in time range)  amoxicillin (AMOXIL) 400 MG/5ML suspension 1,000 mg (1,000 mg Oral Given 02/10/23 2350)    ED Course/ Medical Decision Making/ A&P                                 Medical Decision Making  Tonsillitis, rule out streptococcal disease.  I reviewed her laboratory test, my interpretation is positive PCR for strep, negative PCR for COVID-19 and influenza and RSV.  I have ordered a dose of dexamethasone.  I have offered the patient the choice between injection of benzathine penicillin versus 10 days of oral penicillin and she has selected the  latter.  I have ordered initial dose of amoxicillin and I am discharging her with a prescription for amoxicillin.  Importance of completing the course of antibiotics was stressed.  Final Clinical Impression(s) / ED Diagnoses Final diagnoses:  Strep pharyngitis    Rx / DC Orders ED Discharge Orders          Ordered    amoxicillin (AMOXIL) 400 MG/5ML suspension  2 times daily        02/10/23 2343              Dione Booze, MD 02/10/23 2352

## 2023-02-10 NOTE — ED Triage Notes (Signed)
Sore throat cough and fever started 4 days ago Noted some blood and irritation in throat

## 2023-02-10 NOTE — Discharge Instructions (Addendum)
Drink plenty of fluids.  Take ibuprofen or acetaminophen as needed for fever or aching.  Return if you start having difficulty breathing or swallowing.

## 2023-02-11 ENCOUNTER — Other Ambulatory Visit (HOSPITAL_BASED_OUTPATIENT_CLINIC_OR_DEPARTMENT_OTHER): Payer: Self-pay

## 2023-02-11 ENCOUNTER — Telehealth (HOSPITAL_BASED_OUTPATIENT_CLINIC_OR_DEPARTMENT_OTHER): Payer: Self-pay | Admitting: Emergency Medicine

## 2023-02-11 MED ORDER — FLUCONAZOLE 150 MG PO TABS: 150.0000 mg | ORAL_TABLET | Freq: Every day | ORAL | 0 refills | Status: DC

## 2023-02-11 MED ORDER — AMOXICILLIN 500 MG PO CAPS
500.0000 mg | ORAL_CAPSULE | Freq: Two times a day (BID) | ORAL | 0 refills | Status: AC
  Filled 2023-02-11: qty 20, 10d supply, fill #0

## 2023-02-11 MED ORDER — AMOXICILLIN 400 MG/5ML PO SUSR: 1000.0000 mg | Freq: Two times a day (BID) | ORAL | Status: DC

## 2023-02-11 NOTE — Telephone Encounter (Signed)
Patient called back to the emergency department to state that her amoxicillin which was prescribed overnight 12/24 for strep pharyngitis did not make it to her pharmacy.  She is requesting an updated prescription.

## 2023-03-08 ENCOUNTER — Emergency Department (HOSPITAL_BASED_OUTPATIENT_CLINIC_OR_DEPARTMENT_OTHER): Payer: Medicaid Other

## 2023-03-08 ENCOUNTER — Emergency Department (HOSPITAL_BASED_OUTPATIENT_CLINIC_OR_DEPARTMENT_OTHER)
Admission: EM | Admit: 2023-03-08 | Discharge: 2023-03-09 | Disposition: A | Discharge status: Home / Self Care | Payer: Medicaid Other | Attending: Emergency Medicine | Admitting: Emergency Medicine

## 2023-03-08 ENCOUNTER — Other Ambulatory Visit: Payer: Self-pay

## 2023-03-08 DIAGNOSIS — S6991XA Unspecified injury of right wrist, hand and finger(s), initial encounter: Secondary | ICD-10-CM | POA: Insufficient documentation

## 2023-03-08 DIAGNOSIS — X500XXA Overexertion from strenuous movement or load, initial encounter: Secondary | ICD-10-CM | POA: Insufficient documentation

## 2023-03-08 MED ORDER — NAPROXEN 500 MG PO TABS
500.0000 mg | ORAL_TABLET | Freq: Two times a day (BID) | ORAL | 0 refills | Status: AC | PRN
  Filled 2023-03-08: qty 30, 15d supply, fill #0

## 2023-03-08 MED ORDER — IBUPROFEN 800 MG PO TABS
800.0000 mg | ORAL_TABLET | Freq: Once | ORAL | Status: AC
  Administered 2023-03-08: 800 mg via ORAL
  Filled 2023-03-08: qty 1

## 2023-03-08 NOTE — ED Provider Notes (Signed)
[place] EMERGENCY DEPARTMENT AT [facility] Provider Note   CSN: [id number] Arrival date & time: 03/08/23  2137     History  No chief complaint on file.   Autumn Aguilar is a 23 y.o. female.  Patient presents with right wrist pain.  States she was testing her 78-year-old niece in the area last night and believes she hyperextended her right wrist.  Does not feel the pain last night because she was intoxicated but the wrist has been sore today with reduced range of motion and pain with movement.  Has been applying topical cream without relief.  The pain is to her dorsal and lateral right wrist.  There was no direct trauma.  No weakness, numbness or tingling.  No breaks in the skin.  No cough, runny nose, shortness of breath, fever.  She is right-hand dominant.  The history is provided by the patient.       Home Medications Prior to Admission medications   Medication Sig Start Date End Date Taking? Authorizing Provider  acetaminophen (TYLENOL CHILDRENS) 160 MG/5ML suspension Take 10 mLs (320 mg total) by mouth every 6 (six) hours as needed. 01/28/21   Tanda Rockers A, DO  albuterol (PROVENTIL) (2.5 MG/3ML) 0.083% nebulizer solution Take 3 mLs (2.5 mg total) by nebulization every 4 (four) hours as needed for wheezing or shortness of breath. 02/13/19   Maxwell Caul, PA-C  albuterol (VENTOLIN HFA) 108 (90 Base) MCG/ACT inhaler Inhale 2 puffs into the lungs every 4 (four) hours as needed for shortness of breath. 02/13/19   Graciella Freer A, PA-C  B Complex Vitamins (VITAMIN B COMPLEX PO) Take 1 tablet by mouth daily.    [provider]  cetirizine (ZYRTEC ALLERGY) 10 MG tablet Take 1 tablet (10 mg total) by mouth daily. 07/24/20   Wallis Bamberg, PA-C  clotrimazole (LOTRIMIN) 1 % cream Apply to affected area 2 times daily 09/08/22   Carlisle Beers, FNP  fluconazole (DIFLUCAN) 150 MG tablet Take 1 tablet (150 mg total) by mouth daily. Take after completing the  course of antibiotics 02/11/23   Dione Booze, MD  fluticasone [facility]) 50 MCG/ACT nasal spray Place 1 spray into both nostrils daily. 02/13/19   Maxwell Caul, PA-C  fluticasone (FLONASE) 50 MCG/ACT nasal spray Place 1 spray into both nostrils daily for 7 days. 01/28/21 02/04/21  Tanda Rockers A, DO  ipratropium (ATROVENT) 0.06 % nasal spray Place 1 spray into both nostrils every 6 (six) hours as needed for rhinitis.  12/22/18   [provider]  ondansetron (ZOFRAN) 4 MG tablet Take 1 tablet (4 mg total) by mouth every 8 (eight) hours as needed for nausea or vomiting. 01/28/21   Tanda Rockers A, DO  rizatriptan (MAXALT) 10 MG tablet Take 1 tablet earliest onset of migraine.  May repeat in 2 hours if needed.  Maximum 2 tablets in 24 hours. 02/09/19   Drema Dallas, DO  nortriptyline (PAMELOR) 10 MG capsule Take 1 capsule at bedtime for a week, then increase to 2 capsules at bedtime. 02/09/19 11/23/19  Drema Dallas, DO      Allergies    Bee venom, Kiwi extract, Mucinex clear & cool day-night, and Pineapple    Review of Systems   Review of Systems  Constitutional:  Negative for activity change, appetite change and fever.  HENT:  Negative for congestion and rhinorrhea.   Respiratory:  Negative for cough, chest tightness and shortness of breath.   Cardiovascular:  Negative for  chest pain.  Gastrointestinal:  Negative for abdominal pain, nausea and vomiting.  Genitourinary:  Negative for dysuria and hematuria.  Musculoskeletal:  Positive for arthralgias and myalgias.  Skin:  Negative for rash.  Neurological:  Negative for dizziness, weakness and headaches.   all other systems are negative except as noted in the HPI and PMH.    Physical Exam Updated Vital Signs BP 121/83 (BP Location: Left Arm)   Pulse 94   Temp 98.1 F (36.7 C)   Resp 18   LMP 01/16/2023 (Approximate)   SpO2 100%  Physical Exam Vitals and nursing note reviewed.  Constitutional:      General: She is not in  acute distress.    Appearance: She is well-developed.  HENT:     Head: Normocephalic and atraumatic.     Mouth/Throat:     Pharynx: No oropharyngeal exudate.  Eyes:     Conjunctiva/sclera: Conjunctivae normal.     Pupils: Pupils are equal, round, and reactive to light.  Neck:     Comments: No meningismus. Cardiovascular:     Rate and Rhythm: Normal rate and regular rhythm.     Heart sounds: Normal heart sounds. No murmur heard. Pulmonary:     Effort: Pulmonary effort is normal. No respiratory distress.     Breath sounds: Normal breath sounds.  Abdominal:     Palpations: Abdomen is soft.     Tenderness: There is no abdominal tenderness. There is no guarding or rebound.  Musculoskeletal:        General: Tenderness present. No swelling.     Cervical back: Normal range of motion and neck supple.     Comments: Prominent ulnar styloid.  This is consistent bilaterally.  There is pain to palpation of the right dorsal wrist and lateral wrist with pain with range of motion.  Intact radial pulse and cardinal hand movements.  Full range of motion of elbow without pain.  Skin:    General: Skin is warm.  Neurological:     Mental Status: She is alert and oriented to person, place, and time.     Cranial Nerves: No cranial nerve deficit.     Motor: No abnormal muscle tone.     Coordination: Coordination normal.     Comments:  5/5 strength throughout. CN 2-12 intact.Equal grip strength.   Psychiatric:        Behavior: Behavior normal.     ED Results / Procedures / Treatments   Labs (all labs ordered are listed, but only abnormal results are displayed) Labs Reviewed - No data to display  EKG None  Radiology DG Wrist Complete Right Result Date: 03/08/2023 CLINICAL DATA:  Hyperextension of wrist with intense pain and decreased range of motion EXAM: RIGHT WRIST - COMPLETE 3+ VIEW COMPARISON:  None Available. FINDINGS: There is no evidence of fracture or dislocation. There is no evidence of  arthropathy or other focal bone abnormality. Soft tissues are unremarkable. IMPRESSION: Negative. Electronically Signed   By: Minerva Fester M.D.   On: 03/08/2023 22:29    Procedures Procedures    Medications Ordered in ED Medications  ibuprofen (ADVIL) tablet 800 mg (has no administration in time range)    ED Course/ Medical Decision Making/ A&P                                 Medical Decision Making Amount and/or Complexity of Data Reviewed Labs: ordered. Decision-making details documented in  ED Course. Radiology: ordered and independent interpretation performed. Decision-making details documented in ED Course. ECG/medicine tests: ordered and independent interpretation performed. Decision-making details documented in ED Course.  Risk Prescription drug management.   Hyperextension injury of right wrist.  Neurovascularly intact.  X-ray obtained in triage is negative for fracture or dislocation.  Results reviewed interpreted by me.  Concern for possible ligamentous injury.  Will place in splint, give ice, anti-inflammatories and hand surgery follow-up.        Final Clinical Impression(s) / ED Diagnoses Final diagnoses:  None    Rx / DC Orders ED Discharge Orders     None         Pluma Diniz, Jeannett Senior, MD 03/08/23 2359

## 2023-03-08 NOTE — ED Triage Notes (Signed)
Hyperextension of wrist while tossing niece in the air. Intense pain today with decreased ROM.

## 2023-03-08 NOTE — Discharge Instructions (Signed)
Take the anti-inflammatories as prescribed and follow-up with the hand doctor for reassessment next week.  You likely have a ligament injury from hyperextending her wrist.  Return to the ED with worsening pain weakness, numbness, tingling, other concerns.

## 2023-03-10 ENCOUNTER — Other Ambulatory Visit (HOSPITAL_BASED_OUTPATIENT_CLINIC_OR_DEPARTMENT_OTHER): Payer: Self-pay

## 2023-03-20 ENCOUNTER — Other Ambulatory Visit (HOSPITAL_BASED_OUTPATIENT_CLINIC_OR_DEPARTMENT_OTHER): Payer: Self-pay

## 2023-06-15 ENCOUNTER — Ambulatory Visit
Admission: EM | Admit: 2023-06-15 | Discharge: 2023-06-15 | Disposition: A | Discharge status: Home / Self Care | Attending: Internal Medicine | Admitting: Internal Medicine

## 2023-06-15 ENCOUNTER — Encounter: Payer: Self-pay | Admitting: Emergency Medicine

## 2023-06-15 DIAGNOSIS — J069 Acute upper respiratory infection, unspecified: Secondary | ICD-10-CM | POA: Diagnosis not present

## 2023-06-15 MED ORDER — ALBUTEROL SULFATE HFA 108 (90 BASE) MCG/ACT IN AERS: 2.0000 | INHALATION_SPRAY | RESPIRATORY_TRACT | 1 refills | Status: AC | PRN

## 2023-06-15 NOTE — ED Provider Notes (Signed)
 [facility]    CSN: [id number] Arrival date & time: 06/15/23  1121      History   Chief Complaint No chief complaint on file.   HPI Autumn Aguilar is a 23 y.o. female.   Autumn Aguilar is a 23 y.o. female presenting for chief complaint of cough, nasal congestion, sore throat, and generalized fatigue that started 5 or 6 days ago.  Complains of shortness of breath associated with coughing.  Cough is minimally productive and is worsened at nighttime.  Cough wakes her up out of her sleep.  History of allergic asthma that is mild and intermittent.  This affected her more as a child.  She does not currently have access to an albuterol  inhaler.  Denies chest pain, nausea, vomiting, abdominal pain, rash, and fever.  Her sister is sick with similar symptoms currently.  Taking over-the-counter medications with some relief.      Past Medical History:  Diagnosis Date   Anxiety    Asthma    Eczema    Esophageal stricture     Patient Active Problem List   Diagnosis Date Noted   Sleepiness 09/19/2020   Seasonal and perennial allergic rhinitis 09/19/2020   Allergic asthma, mild intermittent, uncomplicated    GERD (gastroesophageal reflux disease)    Mid back pain 11/11/2016   Esophageal stricture     History reviewed. No pertinent surgical history.  OB History     Gravida  0   Para  0   Term  0   Preterm  0   AB  0   Living  0      SAB  0   IAB  0   Ectopic  0   Multiple  0   Live Births  0            Home Medications    Prior to Admission medications   Medication Sig Start Date End Date Taking? Authorizing Provider  acetaminophen  (TYLENOL  CHILDRENS) 160 MG/5ML suspension Take 10 mLs (320 mg total) by mouth every 6 (six) hours as needed. 01/28/21   Teddi Favors, DO  albuterol  (VENTOLIN  HFA) 108 (90 Base) MCG/ACT inhaler Inhale 2 puffs into the lungs every 4 (four) hours as needed for shortness of breath. 06/15/23   Starlene Eaton,  FNP  B Complex Vitamins (VITAMIN B COMPLEX PO) Take 1 tablet by mouth daily.    [provider]  cetirizine  (ZYRTEC  ALLERGY) 10 MG tablet Take 1 tablet (10 mg total) by mouth daily. 07/24/20   Adolph Hoop, PA-C  clotrimazole  (LOTRIMIN ) 1 % cream Apply to affected area 2 times daily 09/08/22   Darrelle Barrell M, FNP  fluconazole  (DIFLUCAN ) 150 MG tablet Take 1 tablet (150 mg total) by mouth daily. Take after completing the course of antibiotics 02/11/23   Alissa April, MD  fluticasone  (FLONASE ) 50 MCG/ACT nasal spray Place 1 spray into both nostrils daily. 02/13/19   Valla Gauss, PA-C  fluticasone  (FLONASE ) 50 MCG/ACT nasal spray Place 1 spray into both nostrils daily for 7 days. 01/28/21 02/04/21  Russella Courts A, DO  ipratropium (ATROVENT) 0.06 % nasal spray Place 1 spray into both nostrils every 6 (six) hours as needed for rhinitis.  12/22/18   [provider]  naproxen  (NAPROSYN ) 500 MG tablet Take 1 tablet (500 mg total) by mouth 2 (two) times daily as needed. 03/08/23   Rancour, Mara [place], MD  ondansetron  (ZOFRAN ) 4 MG tablet Take 1 tablet (4 mg total) by  mouth every 8 (eight) hours as needed for nausea or vomiting. 01/28/21   Russella Courts A, DO  rizatriptan  (MAXALT ) 10 MG tablet Take 1 tablet earliest onset of migraine.  May repeat in 2 hours if needed.  Maximum 2 tablets in 24 hours. 02/09/19   Merriam Abbey, DO  nortriptyline  (PAMELOR ) 10 MG capsule Take 1 capsule at bedtime for a week, then increase to 2 capsules at bedtime. 02/09/19 11/23/19  Merriam Abbey, DO    Family History History reviewed. No pertinent family history.  Social History Social History   Tobacco Use   Smoking status: Never    Passive exposure: Never   Smokeless tobacco: Never  Vaping Use   Vaping status: Never Used  Substance Use Topics   Alcohol use: Yes    Comment: Social   Drug use: No     Allergies   Bee venom, Kiwi extract, Mucinex  clear & cool day-night, and Pineapple   Review  of Systems Review of Systems Per HPI  Physical Exam Triage Vital Signs ED Triage Vitals  Encounter Vitals Group     BP 06/15/23 1256 105/65     Systolic BP Percentile --      Diastolic BP Percentile --      Pulse Rate 06/15/23 1256 84     Resp 06/15/23 1256 17     Temp 06/15/23 1256 98.3 F (36.8 C)     Temp Source 06/15/23 1256 Oral     SpO2 06/15/23 1256 98 %     Weight --      Height --      Head Circumference --      Peak Flow --      Pain Score 06/15/23 1258 7     Pain Loc --      Pain Education --      Exclude from Growth Chart --    No data found.  Updated Vital Signs BP 105/65 (BP Location: Right Arm)   Pulse 84   Temp 98.3 F (36.8 C) (Oral)   Resp 17   LMP 05/28/2023 (Approximate)   SpO2 98%   Visual Acuity Right Eye Distance:   Left Eye Distance:   Bilateral Distance:    Right Eye Near:   Left Eye Near:    Bilateral Near:     Physical Exam Vitals and nursing note reviewed.  Constitutional:      Appearance: She is not ill-appearing or toxic-appearing.  HENT:     Head: Normocephalic and atraumatic.     Right Ear: Hearing, tympanic membrane, ear canal and external ear normal.     Left Ear: Hearing, tympanic membrane, ear canal and external ear normal.     Nose: Congestion present.     Mouth/Throat:     Lips: Pink.     Mouth: Mucous membranes are moist. No injury or oral lesions.     Dentition: Normal dentition.     Tongue: No lesions.     Pharynx: Oropharynx is clear. Uvula midline. No pharyngeal swelling, oropharyngeal exudate, posterior oropharyngeal erythema, uvula swelling or postnasal drip.     Tonsils: No tonsillar exudate.     Comments: Mild erythema to posterior oropharynx with small amount of clear postnasal drainage visualized. Eyes:     General: Lids are normal. Vision grossly intact. Gaze aligned appropriately.     Extraocular Movements: Extraocular movements intact.     Conjunctiva/sclera: Conjunctivae normal.  Neck:      Trachea: Trachea and phonation normal.  Cardiovascular:     Rate and Rhythm: Normal rate and regular rhythm.     Heart sounds: Normal heart sounds, S1 normal and S2 normal.  Pulmonary:     Effort: Pulmonary effort is normal. No respiratory distress.     Breath sounds: Normal breath sounds and air entry. No wheezing, rhonchi or rales.  Chest:     Chest wall: No tenderness.  Musculoskeletal:     Cervical back: Neck supple.  Lymphadenopathy:     Cervical: No cervical adenopathy.  Skin:    General: Skin is warm and dry.     Capillary Refill: Capillary refill takes less than 2 seconds.     Findings: No rash.  Neurological:     General: No focal deficit present.     Mental Status: She is alert and oriented to person, place, and time. Mental status is at baseline.     Cranial Nerves: No dysarthria or facial asymmetry.  Psychiatric:        Mood and Affect: Mood normal.        Speech: Speech normal.        Behavior: Behavior normal.        Thought Content: Thought content normal.        Judgment: Judgment normal.      UC Treatments / Results  Labs (all labs ordered are listed, but only abnormal results are displayed) Labs Reviewed - No data to display  EKG   Radiology No results found.  Procedures Procedures (including critical care time)  Medications Ordered in UC Medications - No data to display  Initial Impression / Assessment and Plan / UC Course  I have reviewed the triage vital signs and the nursing notes.  Pertinent labs & imaging results that were available during my care of the patient were reviewed by me and considered in my medical decision making (see chart for details).   1.  Viral URI with cough Suspect viral URI, viral syndrome.  Strep/viral testing: deferred given timing of illness.  No wheezing heard on exam.  May use albuterol  inhaler every 4-6 hours as needed for shortness of breath and wheezing. No current signs of asthma exacerbation/bronchitis on  exam.   Physical exam findings reassuring, vital signs hemodynamically stable, and lungs clear, therefore deferred imaging of the chest.  Advised supportive care/prescriptions for symptomatic relief as outlined in AVS.    Counseled patient on potential for adverse effects with medications prescribed/recommended today, strict ER and return-to-clinic precautions discussed, patient verbalized understanding.    Final Clinical Impressions(s) / UC Diagnoses   Final diagnoses:  Viral URI with cough     Discharge Instructions      You have a viral illness which will improve on its own with rest, fluids, and medications to help with your symptoms.  Tylenol , guaifenesin  (plain mucinex ), and saline nasal sprays may help relieve symptoms.   2 puffs of albuterol  every 4-6 hours as needed for coughing and shortness of breath.   Two teaspoons of honey in 1 cup of warm water every 4-6 hours may help with throat pains.  Humidifier in room at nighttime may help soothe cough (clean well daily).   For chest pain, shortness of breath, inability to keep food or fluids down without vomiting, fever that does not respond to tylenol  or motrin , or any other severe symptoms, please go to the ER for further evaluation. Return to urgent care as needed, otherwise follow-up with PCP.      ED Prescriptions  Medication Sig Dispense Auth. Provider   albuterol  (VENTOLIN  HFA) 108 (90 Base) MCG/ACT inhaler Inhale 2 puffs into the lungs every 4 (four) hours as needed for shortness of breath. 8 g Starlene Eaton, FNP      PDMP not reviewed this encounter.   Starlene Eaton, [place] 06/15/23 1339

## 2023-06-15 NOTE — Discharge Instructions (Signed)
 You have a viral illness which will improve on its own with rest, fluids, and medications to help with your symptoms.  Tylenol , guaifenesin  (plain mucinex ), and saline nasal sprays may help relieve symptoms.   2 puffs of albuterol  every 4-6 hours as needed for coughing and shortness of breath.   Two teaspoons of honey in 1 cup of warm water every 4-6 hours may help with throat pains.  Humidifier in room at nighttime may help soothe cough (clean well daily).   For chest pain, shortness of breath, inability to keep food or fluids down without vomiting, fever that does not respond to tylenol  or motrin , or any other severe symptoms, please go to the ER for further evaluation. Return to urgent care as needed, otherwise follow-up with PCP.

## 2023-06-15 NOTE — ED Triage Notes (Signed)
 Pt c/o congestion, cough, scratchy throat since Monday. States on Monday she also had some diarrhea and was nauseous

## 2023-06-22 ENCOUNTER — Other Ambulatory Visit: Payer: Self-pay

## 2023-06-22 ENCOUNTER — Encounter (HOSPITAL_BASED_OUTPATIENT_CLINIC_OR_DEPARTMENT_OTHER): Payer: Self-pay

## 2023-06-22 ENCOUNTER — Emergency Department (HOSPITAL_BASED_OUTPATIENT_CLINIC_OR_DEPARTMENT_OTHER)
Admission: EM | Admit: 2023-06-22 | Discharge: 2023-06-22 | Disposition: A | Discharge status: Home / Self Care | Attending: Emergency Medicine | Admitting: Emergency Medicine

## 2023-06-22 DIAGNOSIS — K625 Hemorrhage of anus and rectum: Secondary | ICD-10-CM | POA: Insufficient documentation

## 2023-06-22 DIAGNOSIS — J45909 Unspecified asthma, uncomplicated: Secondary | ICD-10-CM | POA: Diagnosis not present

## 2023-06-22 DIAGNOSIS — Z7951 Long term (current) use of inhaled steroids: Secondary | ICD-10-CM | POA: Insufficient documentation

## 2023-06-22 DIAGNOSIS — R1030 Lower abdominal pain, unspecified: Secondary | ICD-10-CM | POA: Insufficient documentation

## 2023-06-22 LAB — CBC WITH DIFFERENTIAL/PLATELET
Abs Immature Granulocytes: 0.02 10*3/uL (ref 0.00–0.07)
Basophils Absolute: 0 10*3/uL (ref 0.0–0.1)
Basophils Relative: 1 %
Eosinophils Absolute: 0.1 10*3/uL (ref 0.0–0.5)
Eosinophils Relative: 1 %
HCT: 38.6 % (ref 36.0–46.0)
Hemoglobin: 13.4 g/dL (ref 12.0–15.0)
Immature Granulocytes: 0 %
Lymphocytes Relative: 20 %
Lymphs Abs: 1.7 10*3/uL (ref 0.7–4.0)
MCH: 32 pg (ref 26.0–34.0)
MCHC: 34.7 g/dL (ref 30.0–36.0)
MCV: 92.1 fL (ref 80.0–100.0)
Monocytes Absolute: 0.6 10*3/uL (ref 0.1–1.0)
Monocytes Relative: 7 %
Neutro Abs: 6.2 10*3/uL (ref 1.7–7.7)
Neutrophils Relative %: 71 %
Platelets: 245 10*3/uL (ref 150–400)
RBC: 4.19 MIL/uL (ref 3.87–5.11)
RDW: 12.7 % (ref 11.5–15.5)
WBC: 8.6 10*3/uL (ref 4.0–10.5)
nRBC: 0 % (ref 0.0–0.2)

## 2023-06-22 LAB — URINALYSIS, ROUTINE W REFLEX MICROSCOPIC
Bacteria, UA: NONE SEEN
Bilirubin Urine: NEGATIVE
Glucose, UA: NEGATIVE mg/dL
Hgb urine dipstick: NEGATIVE
Ketones, ur: NEGATIVE mg/dL
Leukocytes,Ua: NEGATIVE
Nitrite: NEGATIVE
Protein, ur: NEGATIVE mg/dL
Specific Gravity, Urine: 1.011 (ref 1.005–1.030)
pH: 7.5 (ref 5.0–8.0)

## 2023-06-22 LAB — COMPREHENSIVE METABOLIC PANEL WITH GFR
ALT: 10 U/L (ref 0–44)
AST: 17 U/L (ref 15–41)
Albumin: 4.5 g/dL (ref 3.5–5.0)
Alkaline Phosphatase: 45 U/L (ref 38–126)
Anion gap: 10 (ref 5–15)
BUN: 9 mg/dL (ref 6–20)
CO2: 23 mmol/L (ref 22–32)
Calcium: 9.3 mg/dL (ref 8.9–10.3)
Chloride: 105 mmol/L (ref 98–111)
Creatinine, Ser: 0.83 mg/dL (ref 0.44–1.00)
GFR, Estimated: >60 mL/min (ref >60)
Glucose, Bld: 83 mg/dL (ref 70–99)
Potassium: 4 mmol/L (ref 3.5–5.1)
Sodium: 137 mmol/L (ref 135–145)
Total Bilirubin: 0.3 mg/dL (ref 0.0–1.2)
Total Protein: 6.9 g/dL (ref 6.5–8.1)

## 2023-06-22 LAB — HCG, QUANTITATIVE, PREGNANCY: hCG, Beta Chain, Quant, S: <1 m[IU]/mL (ref <5)

## 2023-06-22 MED ORDER — FLUCONAZOLE 150 MG PO TABS
150.0000 mg | ORAL_TABLET | Freq: Every day | ORAL | 0 refills | Status: AC
  Filled 2023-06-22: qty 1, 1d supply, fill #0

## 2023-06-22 NOTE — ED Triage Notes (Signed)
 Pt reports abd pain, states she was at work & went to the bathroom "& there was just a lot of blood from my butt." Reports "really bad headache, nausea, dizziness." Advises she thought it was her period, expecting it in approx 3 days. Reports "a lot of stomach issues" for the last few weeks.

## 2023-06-22 NOTE — Discharge Instructions (Signed)
 Thank you for coming to [facility] Emergency Department. You were seen for blood in the stool. We did an exam, labs, and these showed reassuring labs and some blood in the stool. You can take tylenol  1,000 mg every 8 hours for pain and abdominal cramping. Please stay well hydrated. You may continue to have some rectal bleeding, but watch for it to be high volume or get worse.   Please call [facility] to make a follow-up appointment within 1-2 weeks.   Do not hesitate to return to the ED or call 911 if you experience: -Worsening symptoms -Large volume rectal bleeding -Severe abdominal pain -Lightheadedness, passing out -Fevers/chills -Anything else that concerns you

## 2023-06-22 NOTE — ED Provider Notes (Signed)
 [place] EMERGENCY DEPARTMENT AT [facility] Provider Note   CSN: [id number] Arrival date & time: 06/22/23  1545     History  Chief Complaint  Patient presents with   Rectal Bleeding    Autumn Aguilar is a 23 y.o. female with PMH as listed below who presents with lower abdominal cramping and 2 episodes of BRBPR that started this afternoon. Felt dizzy when she noted the blood but doesn't feel dizzy/lightheaded now. Had some stool with blood but also frank blood in the toilet bowl. No h/o hemorrhoids, no rectal/anal pain. No bleeding from anywhere else, no easy bruising. Reports "a lot of stomach issues" for the last few weeks such as intermittent diarrhea/constipation. No h/o IBD or fam h/o IBD. Doesn't take blood thinners. No rectal foreign body. No h/o similar.  Reported that she ate fast food yesterday and has not felt well since then either, notes that the abdominal cramping may have started after that.  Also recently was treated for strep throat with Augmentin which she finished on Wednesday.  Patient also requests a refill of her Diflucan  as she states she thinks she has a yeast infection because she recently took an antibiotic and that is typical for her.  Reports some itching in her vagina.  Past Medical History:  Diagnosis Date   Anxiety    Asthma    Eczema    Esophageal stricture        Home Medications Prior to Admission medications   Medication Sig Start Date End Date Taking? Authorizing Provider  acetaminophen  (TYLENOL  CHILDRENS) 160 MG/5ML suspension Take 10 mLs (320 mg total) by mouth every 6 (six) hours as needed. 01/28/21   Teddi Favors, DO  albuterol  (VENTOLIN  HFA) 108 (90 Base) MCG/ACT inhaler Inhale 2 puffs into the lungs every 4 (four) hours as needed for shortness of breath. 06/15/23   Starlene Eaton, FNP  B Complex Vitamins (VITAMIN B COMPLEX PO) Take 1 tablet by mouth daily.    [provider]  cetirizine  (ZYRTEC  ALLERGY) 10 MG  tablet Take 1 tablet (10 mg total) by mouth daily. 07/24/20   Adolph Hoop, PA-C  clotrimazole  (LOTRIMIN ) 1 % cream Apply to affected area 2 times daily 09/08/22   Starlene Eaton, FNP  fluconazole  (DIFLUCAN ) 150 MG tablet Take 1 tablet (150 mg total) by mouth daily. Take after completing the course of antibiotics 06/22/23   Merdis Stalling, MD  fluticasone  (FLONASE ) 50 MCG/ACT nasal spray Place 1 spray into both nostrils daily. 02/13/19   Valla Gauss, PA-C  fluticasone  (FLONASE ) 50 MCG/ACT nasal spray Place 1 spray into both nostrils daily for 7 days. 01/28/21 02/04/21  Russella Courts A, DO  ipratropium (ATROVENT) 0.06 % nasal spray Place 1 spray into both nostrils every 6 (six) hours as needed for rhinitis.  12/22/18   [provider]  naproxen  (NAPROSYN ) 500 MG tablet Take 1 tablet (500 mg total) by mouth 2 (two) times daily as needed. 03/08/23   Rancour, Mara [place]in[place], MD  ondansetron  (ZOFRAN ) 4 MG tablet Take 1 tablet (4 mg total) by mouth every 8 (eight) hours as needed for nausea or vomiting. 01/28/21   Teddi Favors, DO  rizatriptan  (MAXALT ) 10 MG tablet Take 1 tablet earliest onset of migraine.  May repeat in 2 hours if needed.  Maximum 2 tablets in 24 hours. 02/09/19   Merriam Abbey, DO  nortriptyline  (PAMELOR ) 10 MG capsule Take 1 capsule at bedtime for a week, then increase to 2  capsules at bedtime. 02/09/19 11/23/19  Merriam Abbey, DO      Allergies    Bee venom, Kiwi extract, Mucinex  clear & cool day-night, and Pineapple    Review of Systems   Review of Systems A 10 point review of systems was performed and is negative unless otherwise reported in HPI.  Physical Exam Updated Vital Signs BP 108/62 (BP Location: Right Arm)   Pulse 83   Temp 98.5 F (36.9 C)   Resp 18   LMP 05/28/2023 (Approximate)   SpO2 100%  Physical Exam General: Normal appearing female, lying in bed.  HEENT: Sclera anicteric, MMM, trachea midline.  Cardiology: RRR, no murmurs/rubs/gallops.  Resp:  Normal respiratory rate and effort. CTAB, no wheezes, rhonchi, crackles.  Abd: Soft, mildly tender diffusely in lower quadrants, non-distended. No rebound tenderness or guarding.  Rectal: Performed with nurse tech chaperone.  Normal-appearing external anal sphincter.  No anal fissure or external hemorrhoids noted.  No rectal foreign body, internal hemorrhoid, or rectal masses palpated.  Red stool noted on glove. Fecal occult positive. MSK: No peripheral edema or signs of trauma.  Skin: warm, dry.  Neuro: A&Ox4, CNs II-XII grossly intact. MAEs. Sensation grossly intact.  Psych: Normal mood and affect.   ED Results / Procedures / Treatments   Labs (all labs ordered are listed, but only abnormal results are displayed) Labs Reviewed  URINALYSIS, ROUTINE W REFLEX MICROSCOPIC - Abnormal; Notable for the following components:      Result Value   Color, Urine COLORLESS (*)    All other components within normal limits  CBC WITH DIFFERENTIAL/PLATELET  HCG, QUANTITATIVE, PREGNANCY  COMPREHENSIVE METABOLIC PANEL WITH GFR  POC OCCULT BLOOD, ED    EKG None  Radiology No results found.  Procedures Procedures    Medications Ordered in ED Medications - No data to display  ED Course/ Medical Decision Making/ A&P                          Medical Decision Making Amount and/or Complexity of Data Reviewed Labs: ordered.    This patient presents to the ED for concern of rectal bleeding, this involves an extensive number of treatment options, and is a complaint that carries with it a high risk of complications and morbidity.  I considered the following differential and admission for this acute, potentially life threatening condition.  Overall patient is very well-appearing and hemodynamically stable.  MDM:    Based on clinical presentation, hematochezia reported and clinical exam history and findings, most concerned about LGIB.   DDx for LGIB includes but is not limited to:  Does report  some mild straining and so could have hemorrhoidal etiology. No external hemorrhoids noted on exam, no internal hemorrhoids palpated.  Consider infectious colitis as possible cause, viral gastroenteritis.  Reports mild abdominal cramping, also had fast food yesterday and thinks that temporally the symptoms could be related to that.  Consider foodborne illness.  Will avoid antibiotics at this time, patient is afebrile with no fever and is very well-appearing.  She does not have any leukocytosis either.  Her hemoglobin is stable, no concern for GI hemorrhage. No thrombocytopenia. HDS. Could also have colitis as a result of the Augmentin antibiotic.  Not having significant diarrhea to indicate C. difficile.  Of course must also consider IBD or malignancy though believe these are less likely.  Discussed all of this with patient who is very anxious about the symptoms.  Will refer patient  to GI.  Discussed extensively with patient discharge instructions and return precautions.  Advised staying well-hydrated and following up with GI within 1 to 2 weeks.  Will also refill her Diflucan .    Labs: I Ordered, and personally interpreted labs.  The pertinent results include:  those listed above  Additional history obtained from chart review.  Reevaluation: After the interventions noted above, I reevaluated the patient and found that they have :stayed the same  Social Determinants of Health: Lives independently  Disposition:  DC w/ discharge instructions/return precautions. All questions answered to patient's satisfaction.    Co morbidities that complicate the patient evaluation  Past Medical History:  Diagnosis Date   Anxiety    Asthma    Eczema    Esophageal stricture      Medicines Meds ordered this encounter  Medications   fluconazole  (DIFLUCAN ) 150 MG tablet    Sig: Take 1 tablet (150 mg total) by mouth daily. Take after completing the course of antibiotics    Dispense:  1 tablet    Refill:   0    I have reviewed the patients home medicines and have made adjustments as needed  Problem List / ED Course: Problem List Items Addressed This Visit   None Visit Diagnoses       Rectal bleeding    -  Primary                   This note was created using dictation software, which may contain spelling or grammatical errors.    Merdis Stalling, MD 06/22/23 2035

## 2023-06-23 ENCOUNTER — Other Ambulatory Visit (HOSPITAL_BASED_OUTPATIENT_CLINIC_OR_DEPARTMENT_OTHER): Payer: Self-pay

## 2023-07-03 ENCOUNTER — Other Ambulatory Visit (HOSPITAL_BASED_OUTPATIENT_CLINIC_OR_DEPARTMENT_OTHER): Payer: Self-pay
